# Patient Record
Sex: Male | Born: 1935 | Race: Black or African American | Hispanic: No
Health system: Southern US, Community
[De-identification: ages and names within clinical notes are randomized; demographics above are authoritative.]

---

## 2006-04-19 ENCOUNTER — Ambulatory Visit: Payer: Self-pay | Admitting: Internal Medicine

## 2007-12-18 ENCOUNTER — Emergency Department: Payer: Self-pay | Admitting: Emergency Medicine

## 2009-10-23 ENCOUNTER — Emergency Department: Payer: Self-pay | Admitting: Emergency Medicine

## 2009-11-03 ENCOUNTER — Emergency Department: Payer: Self-pay | Admitting: Emergency Medicine

## 2010-01-21 ENCOUNTER — Ambulatory Visit: Payer: Self-pay | Admitting: Internal Medicine

## 2010-01-22 ENCOUNTER — Inpatient Hospital Stay: Payer: Self-pay | Admitting: Internal Medicine

## 2010-02-03 ENCOUNTER — Ambulatory Visit: Payer: Self-pay | Admitting: Internal Medicine

## 2010-02-21 ENCOUNTER — Ambulatory Visit: Payer: Self-pay | Admitting: Internal Medicine

## 2010-04-08 ENCOUNTER — Inpatient Hospital Stay: Payer: Self-pay | Admitting: Specialist

## 2010-11-18 ENCOUNTER — Emergency Department: Payer: Self-pay | Admitting: Emergency Medicine

## 2010-11-24 ENCOUNTER — Emergency Department: Payer: Self-pay | Admitting: Emergency Medicine

## 2011-01-06 ENCOUNTER — Inpatient Hospital Stay: Payer: Self-pay | Admitting: Internal Medicine

## 2011-04-08 ENCOUNTER — Emergency Department: Payer: Self-pay | Admitting: Emergency Medicine

## 2011-04-08 LAB — COMPREHENSIVE METABOLIC PANEL
Anion Gap: 15 (ref 7–16)
Bilirubin,Total: 0.4 mg/dL (ref 0.2–1.0)
Co2: 23 mmol/L (ref 21–32)
Creatinine: 1.69 mg/dL — ABNORMAL HIGH (ref 0.60–1.30)
EGFR (African American): 51 — ABNORMAL LOW
EGFR (Non-African Amer.): 42 — ABNORMAL LOW
Glucose: 108 mg/dL — ABNORMAL HIGH (ref 65–99)
Osmolality: 315 (ref 275–301)
Sodium: 154 mmol/L — ABNORMAL HIGH (ref 136–145)

## 2011-04-08 LAB — CBC
HGB: 14.1 g/dL (ref 13.0–18.0)
MCH: 29.8 pg (ref 26.0–34.0)
MCHC: 33.6 g/dL (ref 32.0–36.0)
MCV: 89 fL (ref 80–100)
Platelet: 200 10*3/uL (ref 150–440)
RDW: 15.4 % — ABNORMAL HIGH (ref 11.5–14.5)

## 2011-04-08 LAB — TROPONIN I: Troponin-I: <0.02 ng/mL

## 2011-04-08 LAB — PRO B NATRIURETIC PEPTIDE: B-Type Natriuretic Peptide: 1329 pg/mL — ABNORMAL HIGH (ref 0–125)

## 2011-04-08 LAB — CK TOTAL AND CKMB (NOT AT ARMC): CK, Total: 75 U/L (ref 35–232)

## 2011-05-03 ENCOUNTER — Inpatient Hospital Stay: Payer: Self-pay | Admitting: Internal Medicine

## 2011-05-03 LAB — URINALYSIS, COMPLETE
Bacteria: NONE SEEN
Bilirubin,UR: NEGATIVE
Glucose,UR: NEGATIVE mg/dL (ref 0–75)
Hyaline Cast: 9
Nitrite: NEGATIVE
Ph: 6 (ref 4.5–8.0)
Specific Gravity: 1.013 (ref 1.003–1.030)
Squamous Epithelial: <1

## 2011-05-03 LAB — COMPREHENSIVE METABOLIC PANEL
Alkaline Phosphatase: 49 U/L — ABNORMAL LOW (ref 50–136)
Anion Gap: 15 (ref 7–16)
BUN: 21 mg/dL — ABNORMAL HIGH (ref 7–18)
Calcium, Total: 9.3 mg/dL (ref 8.5–10.1)
Chloride: 98 mmol/L (ref 98–107)
Co2: 22 mmol/L (ref 21–32)
EGFR (Non-African Amer.): >60
Osmolality: 274 (ref 275–301)
Potassium: 4.3 mmol/L (ref 3.5–5.1)
SGOT(AST): 23 U/L (ref 15–37)
Sodium: 135 mmol/L — ABNORMAL LOW (ref 136–145)

## 2011-05-03 LAB — CBC
HGB: 13.5 g/dL (ref 13.0–18.0)
MCH: 28.4 pg (ref 26.0–34.0)
MCV: 87 fL (ref 80–100)
RBC: 4.73 10*6/uL (ref 4.40–5.90)
WBC: 10.5 10*3/uL (ref 3.8–10.6)

## 2011-05-03 LAB — LIPASE, BLOOD: Lipase: 61 U/L — ABNORMAL LOW (ref 73–393)

## 2011-05-03 LAB — TROPONIN I
Troponin-I: <0.02 ng/mL
Troponin-I: 0.02 ng/mL

## 2011-05-04 LAB — CBC WITH DIFFERENTIAL/PLATELET
Basophil #: 0 10*3/uL (ref 0.0–0.1)
Basophil %: 0.2 %
Eosinophil %: 2.8 %
HCT: 36.7 % — ABNORMAL LOW (ref 40.0–52.0)
HGB: 12 g/dL — ABNORMAL LOW (ref 13.0–18.0)
Lymphocyte #: 1.5 10*3/uL (ref 1.0–3.6)
Lymphocyte %: 17.7 %
MCHC: 32.8 g/dL (ref 32.0–36.0)
MCV: 88 fL (ref 80–100)
Monocyte %: 15.5 %
Neutrophil #: 5.4 10*3/uL (ref 1.4–6.5)
RDW: 16.8 % — ABNORMAL HIGH (ref 11.5–14.5)
WBC: 8.4 10*3/uL (ref 3.8–10.6)

## 2011-05-04 LAB — BASIC METABOLIC PANEL
Anion Gap: 13 (ref 7–16)
Calcium, Total: 8.6 mg/dL (ref 8.5–10.1)
Chloride: 104 mmol/L (ref 98–107)
Co2: 24 mmol/L (ref 21–32)
EGFR (African American): >60
EGFR (Non-African Amer.): >60
Sodium: 141 mmol/L (ref 136–145)

## 2011-05-09 LAB — CULTURE, BLOOD (SINGLE)

## 2011-05-23 ENCOUNTER — Ambulatory Visit: Payer: Self-pay | Admitting: Internal Medicine

## 2011-05-31 ENCOUNTER — Inpatient Hospital Stay: Payer: Self-pay | Admitting: Internal Medicine

## 2011-05-31 LAB — COMPREHENSIVE METABOLIC PANEL
Albumin: 2.3 g/dL — ABNORMAL LOW (ref 3.4–5.0)
Alkaline Phosphatase: 71 U/L (ref 50–136)
Anion Gap: 16 (ref 7–16)
BUN: 105 mg/dL — ABNORMAL HIGH (ref 7–18)
Bilirubin,Total: 0.4 mg/dL (ref 0.2–1.0)
Calcium, Total: 8.9 mg/dL (ref 8.5–10.1)
Creatinine: 10.55 mg/dL — ABNORMAL HIGH (ref 0.60–1.30)
Potassium: 4.6 mmol/L (ref 3.5–5.1)
SGPT (ALT): 11 U/L — ABNORMAL LOW
Sodium: 139 mmol/L (ref 136–145)
Total Protein: 8.9 g/dL — ABNORMAL HIGH (ref 6.4–8.2)

## 2011-05-31 LAB — CK TOTAL AND CKMB (NOT AT ARMC)
CK, Total: 34 U/L — ABNORMAL LOW (ref 35–232)
CK-MB: 0.9 ng/mL (ref 0.5–3.6)
CK-MB: 1 ng/mL (ref 0.5–3.6)

## 2011-05-31 LAB — CBC
HGB: 11 g/dL — ABNORMAL LOW (ref 13.0–18.0)
MCH: 27.8 pg (ref 26.0–34.0)
MCHC: 33.1 g/dL (ref 32.0–36.0)
MCV: 84 fL (ref 80–100)
RBC: 3.96 10*6/uL — ABNORMAL LOW (ref 4.40–5.90)
RDW: 17.3 % — ABNORMAL HIGH (ref 11.5–14.5)

## 2011-05-31 LAB — URINALYSIS, COMPLETE
Bilirubin,UR: NEGATIVE
Leukocyte Esterase: NEGATIVE
Nitrite: NEGATIVE
Protein: 30
RBC,UR: 6 /HPF (ref 0–5)

## 2011-05-31 LAB — TROPONIN I
Troponin-I: <0.02 ng/mL
Troponin-I: 0.03 ng/mL

## 2011-06-01 LAB — CBC WITH DIFFERENTIAL/PLATELET
Basophil #: 0 10*3/uL (ref 0.0–0.1)
Eosinophil #: 0.6 10*3/uL (ref 0.0–0.7)
HCT: 29.1 % — ABNORMAL LOW (ref 40.0–52.0)
HGB: 9.8 g/dL — ABNORMAL LOW (ref 13.0–18.0)
Lymphocyte #: 1.3 10*3/uL (ref 1.0–3.6)
Lymphocyte %: 9.8 %
MCH: 27.6 pg (ref 26.0–34.0)
MCHC: 33.7 g/dL (ref 32.0–36.0)
MCV: 82 fL (ref 80–100)
Neutrophil #: 9.5 10*3/uL — ABNORMAL HIGH (ref 1.4–6.5)
Neutrophil %: 74.7 %
Platelet: 226 10*3/uL (ref 150–440)
RBC: 3.55 10*6/uL — ABNORMAL LOW (ref 4.40–5.90)
RDW: 18.1 % — ABNORMAL HIGH (ref 11.5–14.5)
WBC: 12.7 10*3/uL — ABNORMAL HIGH (ref 3.8–10.6)

## 2011-06-01 LAB — PHOSPHORUS: Phosphorus: 5.3 mg/dL — ABNORMAL HIGH (ref 2.5–4.9)

## 2011-06-01 LAB — BASIC METABOLIC PANEL
Anion Gap: 14 (ref 7–16)
BUN: 96 mg/dL — ABNORMAL HIGH (ref 7–18)
Chloride: 108 mmol/L — ABNORMAL HIGH (ref 98–107)
Co2: 19 mmol/L — ABNORMAL LOW (ref 21–32)
Creatinine: 9.49 mg/dL — ABNORMAL HIGH (ref 0.60–1.30)
EGFR (Non-African Amer.): 6 — ABNORMAL LOW
Osmolality: 311 (ref 275–301)
Potassium: 4.2 mmol/L (ref 3.5–5.1)

## 2011-06-01 LAB — IRON AND TIBC
Iron Bind.Cap.(Total): 155 ug/dL — ABNORMAL LOW (ref 250–450)
Unbound Iron-Bind.Cap.: 121 ug/dL

## 2011-06-01 LAB — PROTEIN / CREATININE RATIO, URINE: Creatinine, Urine: 77.2 mg/dL (ref 30.0–125.0)

## 2011-06-01 LAB — URIC ACID: Uric Acid: 6.2 mg/dL (ref 3.5–7.2)

## 2011-06-01 LAB — CK TOTAL AND CKMB (NOT AT ARMC): CK-MB: 1.1 ng/mL (ref 0.5–3.6)

## 2011-06-02 LAB — RENAL FUNCTION PANEL
Albumin: 1.8 g/dL — ABNORMAL LOW (ref 3.4–5.0)
Chloride: 112 mmol/L — ABNORMAL HIGH (ref 98–107)
Co2: 16 mmol/L — ABNORMAL LOW (ref 21–32)
Creatinine: 7.43 mg/dL — ABNORMAL HIGH (ref 0.60–1.30)
EGFR (African American): 9 — ABNORMAL LOW
EGFR (Non-African Amer.): 8 — ABNORMAL LOW
Glucose: 79 mg/dL (ref 65–99)
Osmolality: 303 (ref 275–301)
Phosphorus: 4.6 mg/dL (ref 2.5–4.9)

## 2011-06-02 LAB — KAPPA/LAMBDA FREE LIGHT CHAINS (ARMC)

## 2011-06-02 LAB — UR PROT ELECTROPHORESIS, URINE RANDOM

## 2011-06-02 LAB — PROTEIN ELECTROPHORESIS(ARMC)

## 2011-06-03 LAB — BASIC METABOLIC PANEL
BUN: 59 mg/dL — ABNORMAL HIGH (ref 7–18)
EGFR (African American): 10 — ABNORMAL LOW
EGFR (Non-African Amer.): 8 — ABNORMAL LOW
Glucose: 93 mg/dL (ref 65–99)
Potassium: 3.7 mmol/L (ref 3.5–5.1)

## 2011-06-04 LAB — BASIC METABOLIC PANEL
Anion Gap: 11 (ref 7–16)
BUN: 48 mg/dL — ABNORMAL HIGH (ref 7–18)
Calcium, Total: 8 mg/dL — ABNORMAL LOW (ref 8.5–10.1)
Chloride: 108 mmol/L — ABNORMAL HIGH (ref 98–107)
Co2: 24 mmol/L (ref 21–32)
Creatinine: 5.2 mg/dL — ABNORMAL HIGH (ref 0.60–1.30)
EGFR (African American): 12 — ABNORMAL LOW
EGFR (Non-African Amer.): 10 — ABNORMAL LOW
Glucose: 111 mg/dL — ABNORMAL HIGH (ref 65–99)
Osmolality: 298 (ref 275–301)
Potassium: 3.6 mmol/L (ref 3.5–5.1)
Sodium: 143 mmol/L (ref 136–145)

## 2011-06-05 LAB — BASIC METABOLIC PANEL
Anion Gap: 8 (ref 7–16)
Calcium, Total: 7.9 mg/dL — ABNORMAL LOW (ref 8.5–10.1)
Chloride: 106 mmol/L (ref 98–107)
Creatinine: 4.27 mg/dL — ABNORMAL HIGH (ref 0.60–1.30)
EGFR (African American): 15 — ABNORMAL LOW
EGFR (Non-African Amer.): 13 — ABNORMAL LOW
Glucose: 90 mg/dL (ref 65–99)
Osmolality: 292 (ref 275–301)
Potassium: 3.5 mmol/L (ref 3.5–5.1)

## 2011-06-06 LAB — BASIC METABOLIC PANEL
Anion Gap: 12 (ref 7–16)
Calcium, Total: 8 mg/dL — ABNORMAL LOW (ref 8.5–10.1)
Chloride: 102 mmol/L (ref 98–107)
Creatinine: 3.64 mg/dL — ABNORMAL HIGH (ref 0.60–1.30)
EGFR (African American): 18 — ABNORMAL LOW
EGFR (Non-African Amer.): 16 — ABNORMAL LOW
Potassium: 3 mmol/L — ABNORMAL LOW (ref 3.5–5.1)

## 2011-06-06 LAB — PROT IMMUNOELECT,UR-24HR

## 2011-06-07 LAB — CBC WITH DIFFERENTIAL/PLATELET
Basophil %: 0.3 %
Eosinophil %: 5.8 %
Eosinophil: 4 %
HCT: 28.1 % — ABNORMAL LOW (ref 40.0–52.0)
HGB: 8.9 g/dL — ABNORMAL LOW (ref 13.0–18.0)
Lymphocyte #: 1.4 10*3/uL (ref 1.0–3.6)
Lymphocyte %: 13.5 %
MCH: 26.5 pg (ref 26.0–34.0)
Monocytes: 18 %
RDW: 17.6 % — ABNORMAL HIGH (ref 11.5–14.5)
Segmented Neutrophils: 63 %

## 2011-06-07 LAB — BASIC METABOLIC PANEL
BUN: 28 mg/dL — ABNORMAL HIGH (ref 7–18)
Calcium, Total: 8.1 mg/dL — ABNORMAL LOW (ref 8.5–10.1)
Co2: 26 mmol/L (ref 21–32)
Creatinine: 3.15 mg/dL — ABNORMAL HIGH (ref 0.60–1.30)
EGFR (African American): 22 — ABNORMAL LOW
EGFR (Non-African Amer.): 19 — ABNORMAL LOW
Glucose: 85 mg/dL (ref 65–99)

## 2011-06-07 LAB — RETICULOCYTES: Reticulocyte: 0.85 % (ref 0.5–1.5)

## 2011-06-08 LAB — BASIC METABOLIC PANEL
Calcium, Total: 7.9 mg/dL — ABNORMAL LOW (ref 8.5–10.1)
Chloride: 105 mmol/L (ref 98–107)
Co2: 25 mmol/L (ref 21–32)
Creatinine: 2.89 mg/dL — ABNORMAL HIGH (ref 0.60–1.30)
EGFR (Non-African Amer.): 21 — ABNORMAL LOW
Osmolality: 287 (ref 275–301)
Potassium: 3.4 mmol/L — ABNORMAL LOW (ref 3.5–5.1)

## 2011-06-09 LAB — BASIC METABOLIC PANEL
Anion Gap: 11 (ref 7–16)
BUN: 24 mg/dL — ABNORMAL HIGH (ref 7–18)
Calcium, Total: 8.1 mg/dL — ABNORMAL LOW (ref 8.5–10.1)
Chloride: 104 mmol/L (ref 98–107)
Co2: 25 mmol/L (ref 21–32)
Creatinine: 2.57 mg/dL — ABNORMAL HIGH (ref 0.60–1.30)
EGFR (African American): 28 — ABNORMAL LOW
EGFR (Non-African Amer.): 24 — ABNORMAL LOW
Glucose: 127 mg/dL — ABNORMAL HIGH (ref 65–99)
Osmolality: 285 (ref 275–301)
Potassium: 3.6 mmol/L (ref 3.5–5.1)
Sodium: 140 mmol/L (ref 136–145)

## 2011-06-10 LAB — BASIC METABOLIC PANEL
Anion Gap: 11 (ref 7–16)
BUN: 29 mg/dL — ABNORMAL HIGH (ref 7–18)
Calcium, Total: 8.1 mg/dL — ABNORMAL LOW (ref 8.5–10.1)
Chloride: 104 mmol/L (ref 98–107)
Co2: 25 mmol/L (ref 21–32)
EGFR (African American): 35 — ABNORMAL LOW
EGFR (Non-African Amer.): 30 — ABNORMAL LOW
Osmolality: 286 (ref 275–301)
Potassium: 3.6 mmol/L (ref 3.5–5.1)

## 2011-06-11 LAB — TROPONIN I: Troponin-I: 0.03 ng/mL

## 2011-06-12 LAB — CBC WITH DIFFERENTIAL/PLATELET
Basophil #: 0 10*3/uL (ref 0.0–0.1)
Eosinophil #: 0.1 10*3/uL (ref 0.0–0.7)
HCT: 30.8 % — ABNORMAL LOW (ref 40.0–52.0)
Lymphocyte %: 23.5 %
MCV: 86 fL (ref 80–100)
Monocyte %: 14.6 %
Neutrophil #: 5.5 10*3/uL (ref 1.4–6.5)
Neutrophil %: 60.7 %
RBC: 3.56 10*6/uL — ABNORMAL LOW (ref 4.40–5.90)
RDW: 17.6 % — ABNORMAL HIGH (ref 11.5–14.5)
WBC: 9 10*3/uL (ref 3.8–10.6)

## 2011-06-12 LAB — BASIC METABOLIC PANEL
Anion Gap: 11 (ref 7–16)
BUN: 32 mg/dL — ABNORMAL HIGH (ref 7–18)
Calcium, Total: 8.3 mg/dL — ABNORMAL LOW (ref 8.5–10.1)
Co2: 24 mmol/L (ref 21–32)
Creatinine: 1.42 mg/dL — ABNORMAL HIGH (ref 0.60–1.30)
Glucose: 75 mg/dL (ref 65–99)
Osmolality: 285 (ref 275–301)

## 2011-06-12 LAB — FERRITIN: Ferritin (ARMC): 544 ng/mL — ABNORMAL HIGH (ref 8–388)

## 2011-06-13 LAB — BASIC METABOLIC PANEL
Anion Gap: 8 (ref 7–16)
BUN: 34 mg/dL — ABNORMAL HIGH (ref 7–18)
Chloride: 107 mmol/L (ref 98–107)
Creatinine: 1.52 mg/dL — ABNORMAL HIGH (ref 0.60–1.30)
EGFR (African American): 52 — ABNORMAL LOW
Osmolality: 293 (ref 275–301)
Potassium: 4.3 mmol/L (ref 3.5–5.1)
Sodium: 143 mmol/L (ref 136–145)

## 2011-06-13 LAB — IRON AND TIBC
Iron Saturation: 20 %
Iron: 39 ug/dL — ABNORMAL LOW (ref 65–175)
Unbound Iron-Bind.Cap.: 153 ug/dL

## 2011-06-14 LAB — BASIC METABOLIC PANEL
Anion Gap: 7 (ref 7–16)
Calcium, Total: 8.6 mg/dL (ref 8.5–10.1)
Co2: 29 mmol/L (ref 21–32)
EGFR (Non-African Amer.): 47 — ABNORMAL LOW
Glucose: 89 mg/dL (ref 65–99)
Osmolality: 286 (ref 275–301)
Potassium: 3.9 mmol/L (ref 3.5–5.1)
Sodium: 140 mmol/L (ref 136–145)

## 2011-06-22 ENCOUNTER — Ambulatory Visit: Payer: Self-pay | Admitting: Internal Medicine

## 2011-07-20 ENCOUNTER — Ambulatory Visit: Payer: Self-pay | Admitting: Internal Medicine

## 2011-07-20 LAB — CBC CANCER CENTER
Basophil #: 0 x10 3/mm (ref 0.0–0.1)
Basophil %: 0.7 %
Eosinophil #: 0.3 x10 3/mm (ref 0.0–0.7)
HCT: 32.8 % — ABNORMAL LOW (ref 40.0–52.0)
Lymphocyte %: 37.2 %
MCHC: 31.7 g/dL — ABNORMAL LOW (ref 32.0–36.0)
Monocyte #: 0.8 x10 3/mm (ref 0.2–1.0)
Monocyte %: 13.6 %
Neutrophil #: 2.4 x10 3/mm (ref 1.4–6.5)
Neutrophil %: 43.5 %
Platelet: 254 x10 3/mm (ref 150–440)
RBC: 3.62 10*6/uL — ABNORMAL LOW (ref 4.40–5.90)
RDW: 17.4 % — ABNORMAL HIGH (ref 11.5–14.5)
WBC: 5.6 x10 3/mm (ref 3.8–10.6)

## 2011-07-20 LAB — BASIC METABOLIC PANEL
BUN: 12 mg/dL (ref 7–18)
Calcium, Total: 8.8 mg/dL (ref 8.5–10.1)
Chloride: 107 mmol/L (ref 98–107)
Co2: 29 mmol/L (ref 21–32)
EGFR (African American): >60
EGFR (Non-African Amer.): >60
Osmolality: 288 (ref 275–301)

## 2011-07-23 ENCOUNTER — Ambulatory Visit: Payer: Self-pay | Admitting: Internal Medicine

## 2011-11-22 IMAGING — CT CT NECK WITHOUT CONTRAST
1 of 3 series · 7 of 14 positions shown, 9 images · non-contrast
Comparison: none

REASON FOR EXAM: eval left neck pain with palpable mass
COMMENTS:

PROCEDURE:     CT  - CT NECK WITHOUT CONTRAST  - October 23, 2009  [DATE]
RESULT:
HISTORY: Pain and mass.
COMPARISON STUDY:   No prior.

[Series 2: soft tissue · axial · 0.44mm/px · z∈[+206,+430]mm · 7 of 101 slices shown, 9 images]
[im 13/101  soft-tissue]
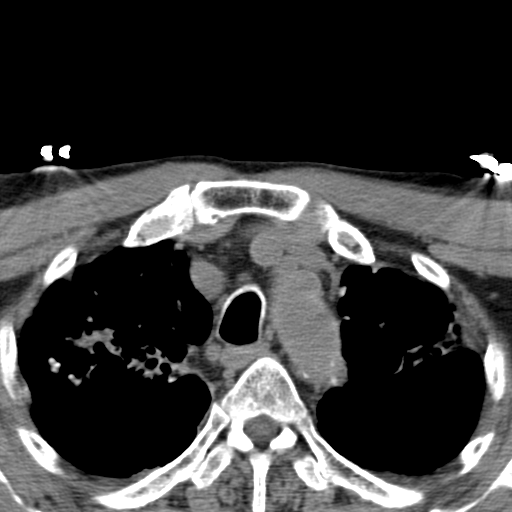
[im 13/101  bone]
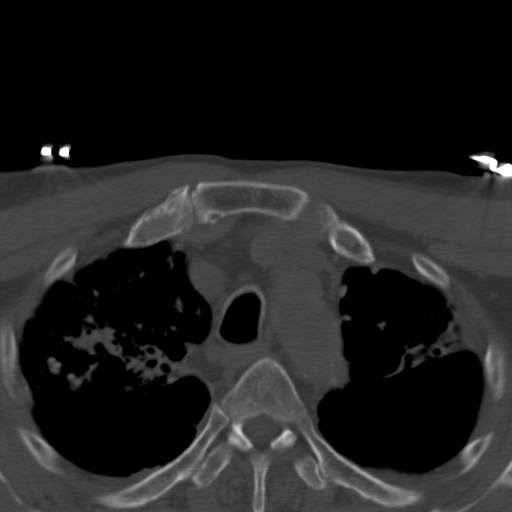
[im 26/101  bone]
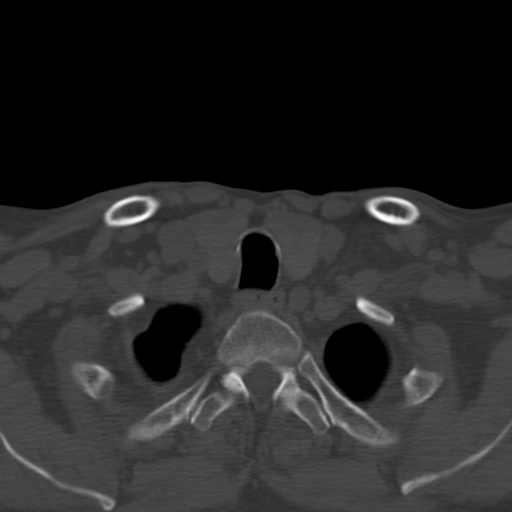
[im 38/101  bone]
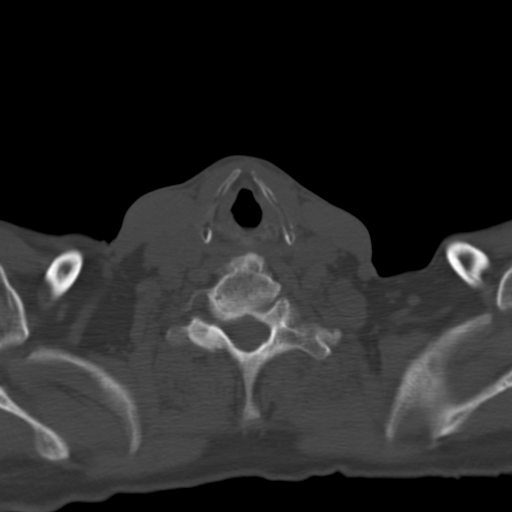
[im 51/101  bone]
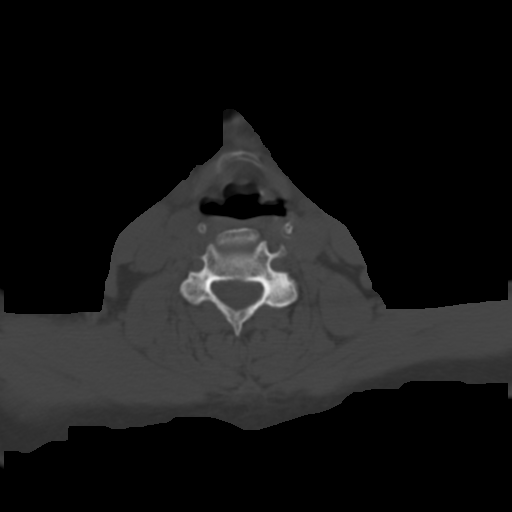
[im 63/101  soft-tissue]
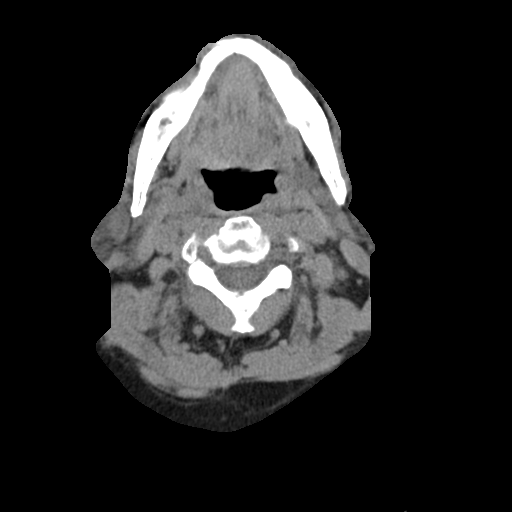
[im 63/101  bone]
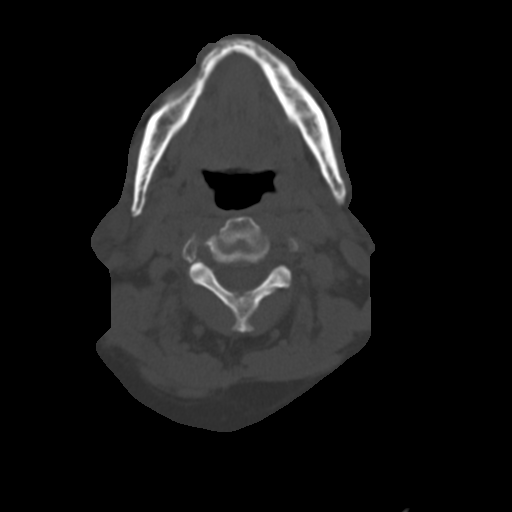
[im 76/101  bone]
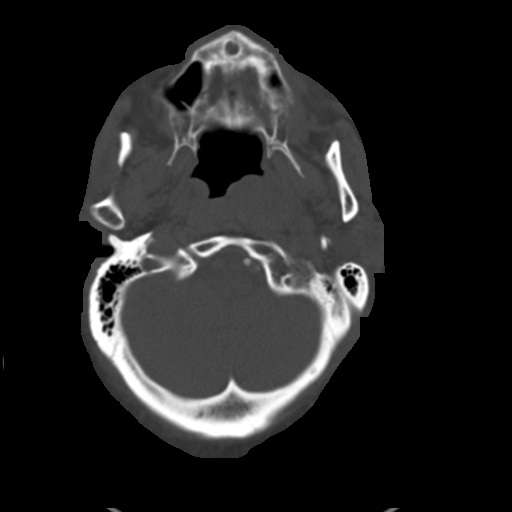
[im 88/101  bone]
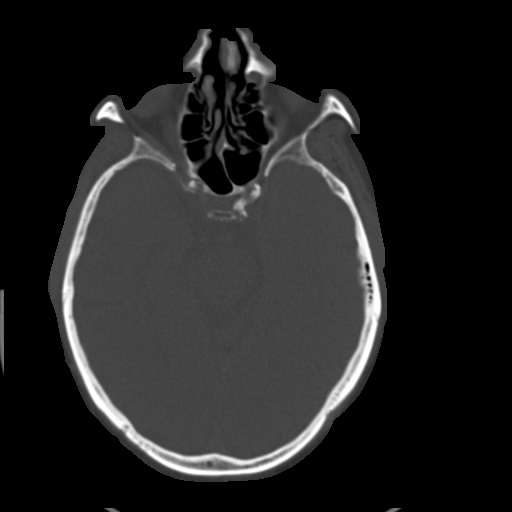

[7 of 14 positions shown; findings below may reference images not displayed]

FINDINGS: Standard nonenhanced CT of the neck was obtained. No recent
prior studies available for comparison. There is soft tissue prominence in
the left posterior nasopharyngeal and oral pharyngeal mucosa. This is
suspicious for a malignancy in this region. Direct visualization for further
evaluation is suggested. The frontal sinuses, ethmoid sinuses, maxillary
sinuses and sphenoid sinuses are clear. Parotid glands are normal.
Submandibular glands are normal. Shotty cervical lymph nodes are noted.
Larynx is grossly normal. Thyroid is normal. Exam is limited due to the lack
of contrast. Extensive apical pulmonary nodularity and bullous change is
present. The possibility of active granulomatous disease or pneumonitis
presenting in this fashion cannot be excluded. The possibility of malignancy
including metastatic disease cannot be excluded. A PET CT with
high-resolution neck images may prove useful for further evaluation.
IMPRESSION: 1. Possible left oropharyngeal/nasopharyngeal mass lesion.
2. Grossly abnormal pulmonary apices with multiple nodules and bullous
changes. The possibility of underlying active granulomas disease including
TB presenting in this fashion cannot be excluded. The possibly of underlying
primary tumor or metastatic disease cannot be excluded. A PET CT with
high-resolution neck images may prove useful for further evaluation of this
patient.

## 2012-03-04 ENCOUNTER — Ambulatory Visit: Payer: Self-pay | Admitting: Internal Medicine

## 2012-03-08 ENCOUNTER — Ambulatory Visit: Payer: Self-pay | Admitting: Gastroenterology

## 2012-05-02 LAB — COMPREHENSIVE METABOLIC PANEL
Albumin: 3.4 g/dL (ref 3.4–5.0)
BUN: 20 mg/dL — ABNORMAL HIGH (ref 7–18)
Bilirubin,Total: 0.2 mg/dL (ref 0.2–1.0)
Chloride: 111 mmol/L — ABNORMAL HIGH (ref 98–107)
Co2: 24 mmol/L (ref 21–32)
Creatinine: 1.43 mg/dL — ABNORMAL HIGH (ref 0.60–1.30)
EGFR (African American): 56 — ABNORMAL LOW
Glucose: 140 mg/dL — ABNORMAL HIGH (ref 65–99)
Potassium: 3.7 mmol/L (ref 3.5–5.1)
Sodium: 141 mmol/L (ref 136–145)
Total Protein: 8.5 g/dL — ABNORMAL HIGH (ref 6.4–8.2)

## 2012-05-02 LAB — TROPONIN I: Troponin-I: 0.02 ng/mL

## 2012-05-02 LAB — CBC
MCH: 28.2 pg (ref 26.0–34.0)
MCHC: 32.8 g/dL (ref 32.0–36.0)
MCV: 86 fL (ref 80–100)
RBC: 4.43 10*6/uL (ref 4.40–5.90)
RDW: 15.1 % — ABNORMAL HIGH (ref 11.5–14.5)

## 2012-05-02 LAB — CK TOTAL AND CKMB (NOT AT ARMC): CK, Total: 305 U/L — ABNORMAL HIGH (ref 35–232)

## 2012-05-03 ENCOUNTER — Inpatient Hospital Stay: Payer: Self-pay | Admitting: Internal Medicine

## 2012-05-03 LAB — CBC WITH DIFFERENTIAL/PLATELET
Basophil %: 0.4 %
Eosinophil #: 0.5 10*3/uL (ref 0.0–0.7)
HGB: 12 g/dL — ABNORMAL LOW (ref 13.0–18.0)
Lymphocyte #: 1.8 10*3/uL (ref 1.0–3.6)
Lymphocyte %: 30.1 %
MCH: 28.4 pg (ref 26.0–34.0)
MCV: 87 fL (ref 80–100)
Neutrophil #: 2.9 10*3/uL (ref 1.4–6.5)
Platelet: 152 10*3/uL (ref 150–440)
RBC: 4.21 10*6/uL — ABNORMAL LOW (ref 4.40–5.90)
WBC: 6.1 10*3/uL (ref 3.8–10.6)

## 2012-05-03 LAB — COMPREHENSIVE METABOLIC PANEL
BUN: 15 mg/dL (ref 7–18)
Calcium, Total: 8.1 mg/dL — ABNORMAL LOW (ref 8.5–10.1)
Chloride: 112 mmol/L — ABNORMAL HIGH (ref 98–107)
Co2: 24 mmol/L (ref 21–32)
Creatinine: 1.27 mg/dL (ref 0.60–1.30)
Osmolality: 285 (ref 275–301)
Potassium: 3.9 mmol/L (ref 3.5–5.1)
SGPT (ALT): 16 U/L (ref 12–78)
Total Protein: 7.3 g/dL (ref 6.4–8.2)

## 2012-05-03 LAB — LIPID PANEL
Cholesterol: 132 mg/dL (ref 0–200)
Ldl Cholesterol, Calc: 73 mg/dL (ref 0–100)
VLDL Cholesterol, Calc: 15 mg/dL (ref 5–40)

## 2012-05-04 LAB — SEDIMENTATION RATE: Erythrocyte Sed Rate: 31 mm/hr — ABNORMAL HIGH (ref 0–20)

## 2012-05-07 LAB — EXPECTORATED SPUTUM ASSESSMENT W GRAM STAIN, RFLX TO RESP C

## 2012-05-09 LAB — EXPECTORATED SPUTUM ASSESSMENT W GRAM STAIN, RFLX TO RESP C

## 2012-06-29 ENCOUNTER — Ambulatory Visit: Payer: Self-pay | Admitting: Internal Medicine

## 2012-07-02 LAB — CBC CANCER CENTER
Eosinophil %: 7.4 %
HCT: 39.1 % — ABNORMAL LOW (ref 40.0–52.0)
Lymphocyte #: 1.9 x10 3/mm (ref 1.0–3.6)
Lymphocyte %: 28.8 %
MCHC: 32.8 g/dL (ref 32.0–36.0)
MCV: 88 fL (ref 80–100)
Monocyte #: 0.8 x10 3/mm (ref 0.2–1.0)
Monocyte %: 12.1 %
Neutrophil #: 3.3 x10 3/mm (ref 1.4–6.5)
Neutrophil %: 50.9 %
RDW: 16.2 % — ABNORMAL HIGH (ref 11.5–14.5)
WBC: 6.5 x10 3/mm (ref 3.8–10.6)

## 2012-07-02 LAB — CREATININE, SERUM: EGFR (African American): 54 — ABNORMAL LOW

## 2012-07-22 ENCOUNTER — Ambulatory Visit: Payer: Self-pay | Admitting: Internal Medicine

## 2013-04-30 ENCOUNTER — Emergency Department: Payer: Self-pay | Admitting: Emergency Medicine

## 2013-04-30 ENCOUNTER — Ambulatory Visit (HOSPITAL_COMMUNITY)
Admission: AD | Admit: 2013-04-30 | Discharge: 2013-04-30 | Disposition: A | Discharge status: Home / Self Care | Payer: Medicare (Managed Care) | Source: Other Acute Inpatient Hospital | Attending: Emergency Medicine | Admitting: Emergency Medicine

## 2013-04-30 DIAGNOSIS — I609 Nontraumatic subarachnoid hemorrhage, unspecified: Secondary | ICD-10-CM | POA: Insufficient documentation

## 2013-04-30 LAB — CBC WITH DIFFERENTIAL/PLATELET
Basophil #: 0 10*3/uL (ref 0.0–0.1)
Basophil %: 0.4 %
EOS ABS: 0.3 10*3/uL (ref 0.0–0.7)
EOS PCT: 3.1 %
HCT: 42.3 % (ref 40.0–52.0)
HGB: 14 g/dL (ref 13.0–18.0)
Lymphocyte #: 1.5 10*3/uL (ref 1.0–3.6)
Lymphocyte %: 17.4 %
MCH: 29.7 pg (ref 26.0–34.0)
MCHC: 33.2 g/dL (ref 32.0–36.0)
MCV: 89 fL (ref 80–100)
MONOS PCT: 8.9 %
Monocyte #: 0.8 x10 3/mm (ref 0.2–1.0)
NEUTROS ABS: 6.2 10*3/uL (ref 1.4–6.5)
Neutrophil %: 70.2 %
PLATELETS: 159 10*3/uL (ref 150–440)
RBC: 4.73 10*6/uL (ref 4.40–5.90)
RDW: 15.3 % — ABNORMAL HIGH (ref 11.5–14.5)
WBC: 8.9 10*3/uL (ref 3.8–10.6)

## 2013-04-30 LAB — COMPREHENSIVE METABOLIC PANEL
ALBUMIN: 4 g/dL (ref 3.4–5.0)
ALT: 27 U/L (ref 12–78)
ANION GAP: 4 — AB (ref 7–16)
Alkaline Phosphatase: 54 U/L
BUN: 20 mg/dL — ABNORMAL HIGH (ref 7–18)
Bilirubin,Total: 0.5 mg/dL (ref 0.2–1.0)
CREATININE: 1.4 mg/dL — AB (ref 0.60–1.30)
Calcium, Total: 9.5 mg/dL (ref 8.5–10.1)
Chloride: 105 mmol/L (ref 98–107)
Co2: 30 mmol/L (ref 21–32)
EGFR (African American): 57 — ABNORMAL LOW
EGFR (Non-African Amer.): 49 — ABNORMAL LOW
Glucose: 100 mg/dL — ABNORMAL HIGH (ref 65–99)
Osmolality: 280 (ref 275–301)
POTASSIUM: 4.5 mmol/L (ref 3.5–5.1)
SGOT(AST): 39 U/L — ABNORMAL HIGH (ref 15–37)
SODIUM: 139 mmol/L (ref 136–145)
Total Protein: 9 g/dL — ABNORMAL HIGH (ref 6.4–8.2)

## 2013-04-30 LAB — PROTIME-INR
INR: 1
PROTHROMBIN TIME: 13 s (ref 11.5–14.7)

## 2013-06-01 IMAGING — CR DG CHEST 1V PORT
1 series · 1 of 1 positions shown · non-contrast
Comparison: none

REASON FOR EXAM: TACHYCARDIA
COMMENTS:

PROCEDURE:     DXR - DXR PORTABLE CHEST SINGLE VIEW  - May 03, 2011  [DATE]
RESULT:     Comparison: 04/08/2011 and CT of the chest 04/09/2010

[portable]
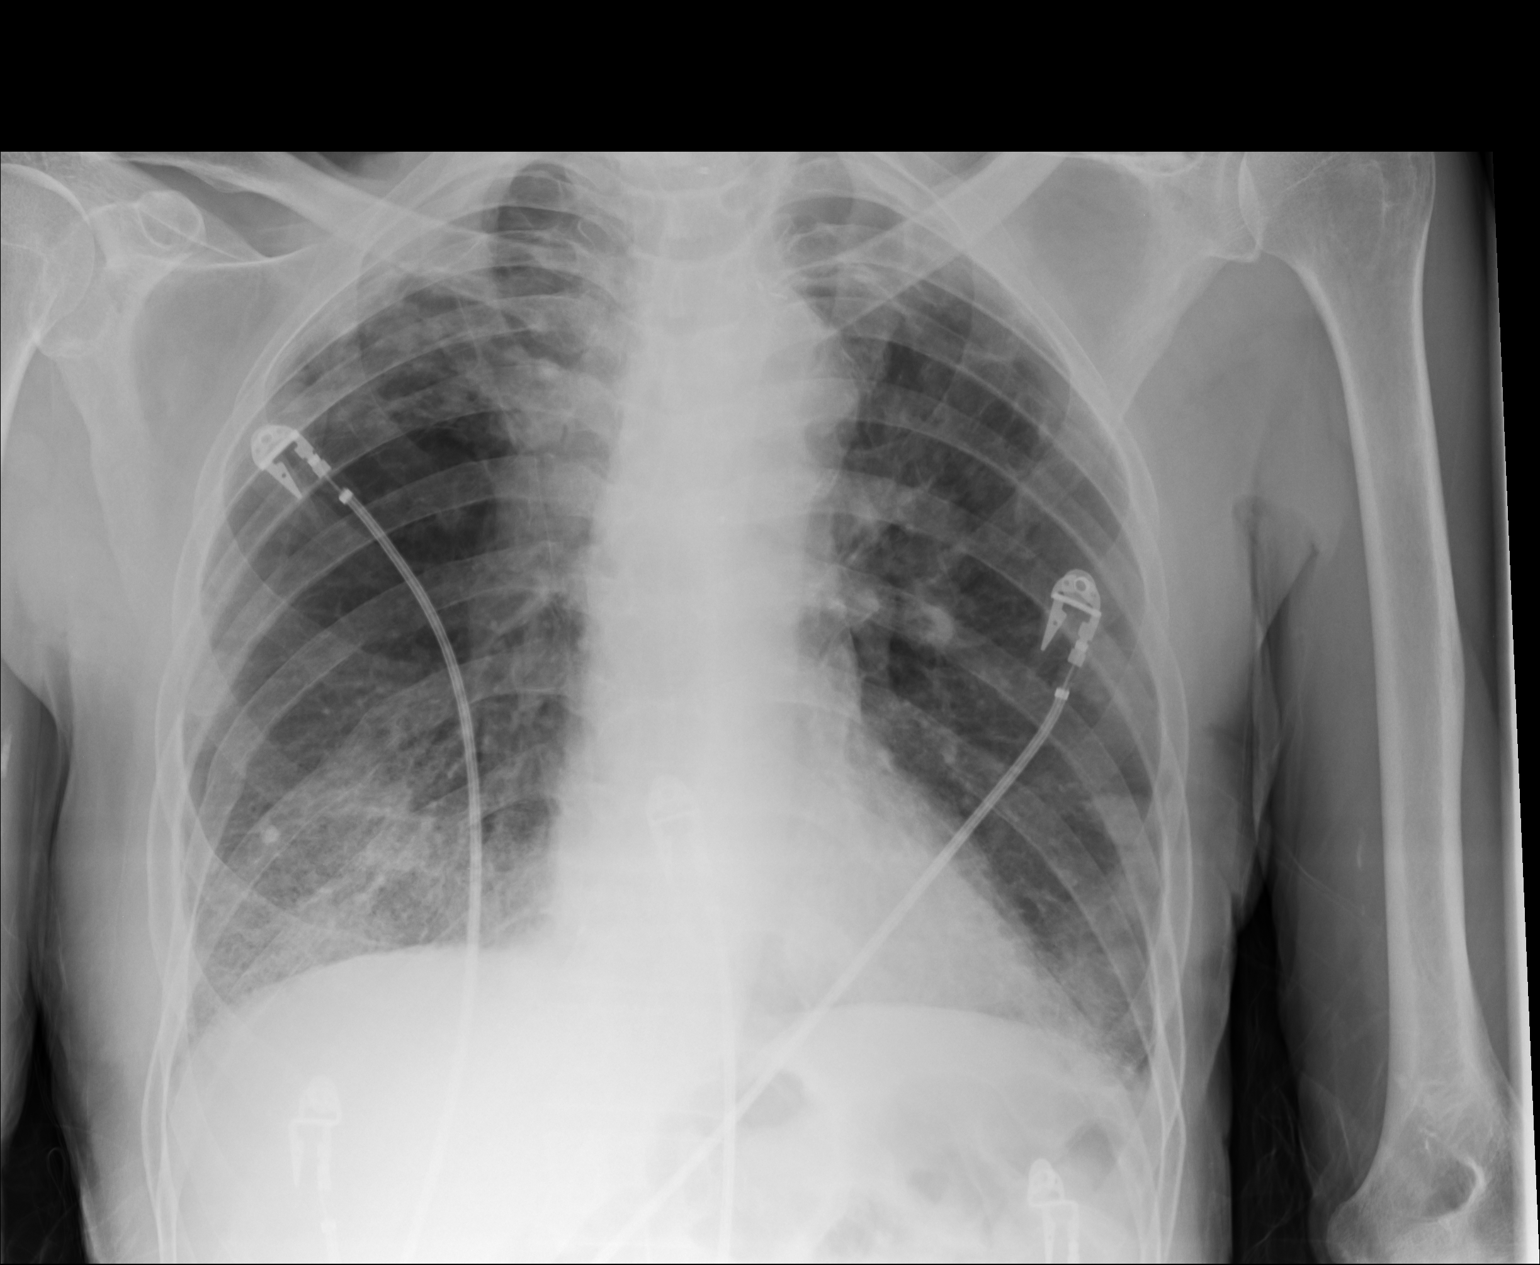

[1 of 1 positions shown; findings below may reference images not displayed]

FINDINGS: There are new heterogeneous opacities in the right lower lobe concerning for
infection. The heart and mediastinum are stable. Multiple nodular densities
in the right upper lobe and left lung are similar to prior and consistent
with the calcified nodules seen on the prior chest CT. Superior traction the
right hilum is similar to prior and likely secondary to scarring and
fibrotic changes. Minimal reticular opacities the left lung base are similar
to prior. There are bullous changes at the lung apices, similar to prior.
IMPRESSION: Findings concerning for pneumonia in the right lower lung. Followup
radiographs are recommended to ensure resolution.

## 2013-06-28 ENCOUNTER — Ambulatory Visit: Payer: Self-pay | Admitting: Internal Medicine

## 2013-07-02 ENCOUNTER — Emergency Department: Payer: Self-pay | Admitting: Emergency Medicine

## 2013-07-02 LAB — COMPREHENSIVE METABOLIC PANEL
AST: 29 U/L (ref 15–37)
Albumin: 2.7 g/dL — ABNORMAL LOW (ref 3.4–5.0)
Alkaline Phosphatase: 40 U/L — ABNORMAL LOW
Anion Gap: 3 — ABNORMAL LOW (ref 7–16)
BILIRUBIN TOTAL: 0.2 mg/dL (ref 0.2–1.0)
BUN: 10 mg/dL (ref 7–18)
Calcium, Total: 8.8 mg/dL (ref 8.5–10.1)
Chloride: 111 mmol/L — ABNORMAL HIGH (ref 98–107)
Co2: 29 mmol/L (ref 21–32)
Creatinine: 1.09 mg/dL (ref 0.60–1.30)
EGFR (African American): >60
Glucose: 101 mg/dL — ABNORMAL HIGH (ref 65–99)
Osmolality: 284 (ref 275–301)
Potassium: 3.9 mmol/L (ref 3.5–5.1)
SGPT (ALT): 13 U/L (ref 12–78)
Sodium: 143 mmol/L (ref 136–145)
TOTAL PROTEIN: 7.5 g/dL (ref 6.4–8.2)

## 2013-07-02 LAB — URINALYSIS, COMPLETE
BLOOD: NEGATIVE
Bacteria: NONE SEEN
Bilirubin,UR: NEGATIVE
GLUCOSE, UR: NEGATIVE mg/dL (ref 0–75)
Ketone: NEGATIVE
NITRITE: NEGATIVE
Ph: 7 (ref 4.5–8.0)
Protein: NEGATIVE
RBC,UR: NONE SEEN /HPF (ref 0–5)
SPECIFIC GRAVITY: 1.041 (ref 1.003–1.030)
SQUAMOUS EPITHELIAL: NONE SEEN
WBC UR: <1 /HPF (ref 0–5)

## 2013-07-02 LAB — CBC WITH DIFFERENTIAL/PLATELET
BASOS ABS: 0.1 10*3/uL (ref 0.0–0.1)
Basophil %: 0.5 %
Eosinophil #: 0.5 10*3/uL (ref 0.0–0.7)
Eosinophil %: 5.7 %
HCT: 35.9 % — ABNORMAL LOW (ref 40.0–52.0)
HGB: 11.5 g/dL — ABNORMAL LOW (ref 13.0–18.0)
LYMPHS ABS: 1.5 10*3/uL (ref 1.0–3.6)
Lymphocyte %: 16 %
MCH: 28.7 pg (ref 26.0–34.0)
MCHC: 32.1 g/dL (ref 32.0–36.0)
MCV: 89 fL (ref 80–100)
MONO ABS: 1.6 x10 3/mm — AB (ref 0.2–1.0)
MONOS PCT: 17.2 %
NEUTROS ABS: 5.6 10*3/uL (ref 1.4–6.5)
NEUTROS PCT: 60.6 %
Platelet: 213 10*3/uL (ref 150–440)
RBC: 4.02 10*6/uL — AB (ref 4.40–5.90)
RDW: 16 % — ABNORMAL HIGH (ref 11.5–14.5)
WBC: 9.3 10*3/uL (ref 3.8–10.6)

## 2013-07-02 LAB — LIPASE, BLOOD: LIPASE: 113 U/L (ref 73–393)

## 2013-07-04 IMAGING — CR DG CHEST 2V
1 series · 3 of 3 positions shown · non-contrast
Comparison: none

REASON FOR EXAM: follow up pneumonia
COMMENTS:

PROCEDURE:     DXR - DXR CHEST PA (OR AP) AND LATERAL  - June 05, 2011  [DATE]
RESULT:     Comparison is made to a prior study dated 05/31/2011.

[Series 5: w chest pa · 0.14mm/px · 3 of 3 slices shown]
[im 1/3]
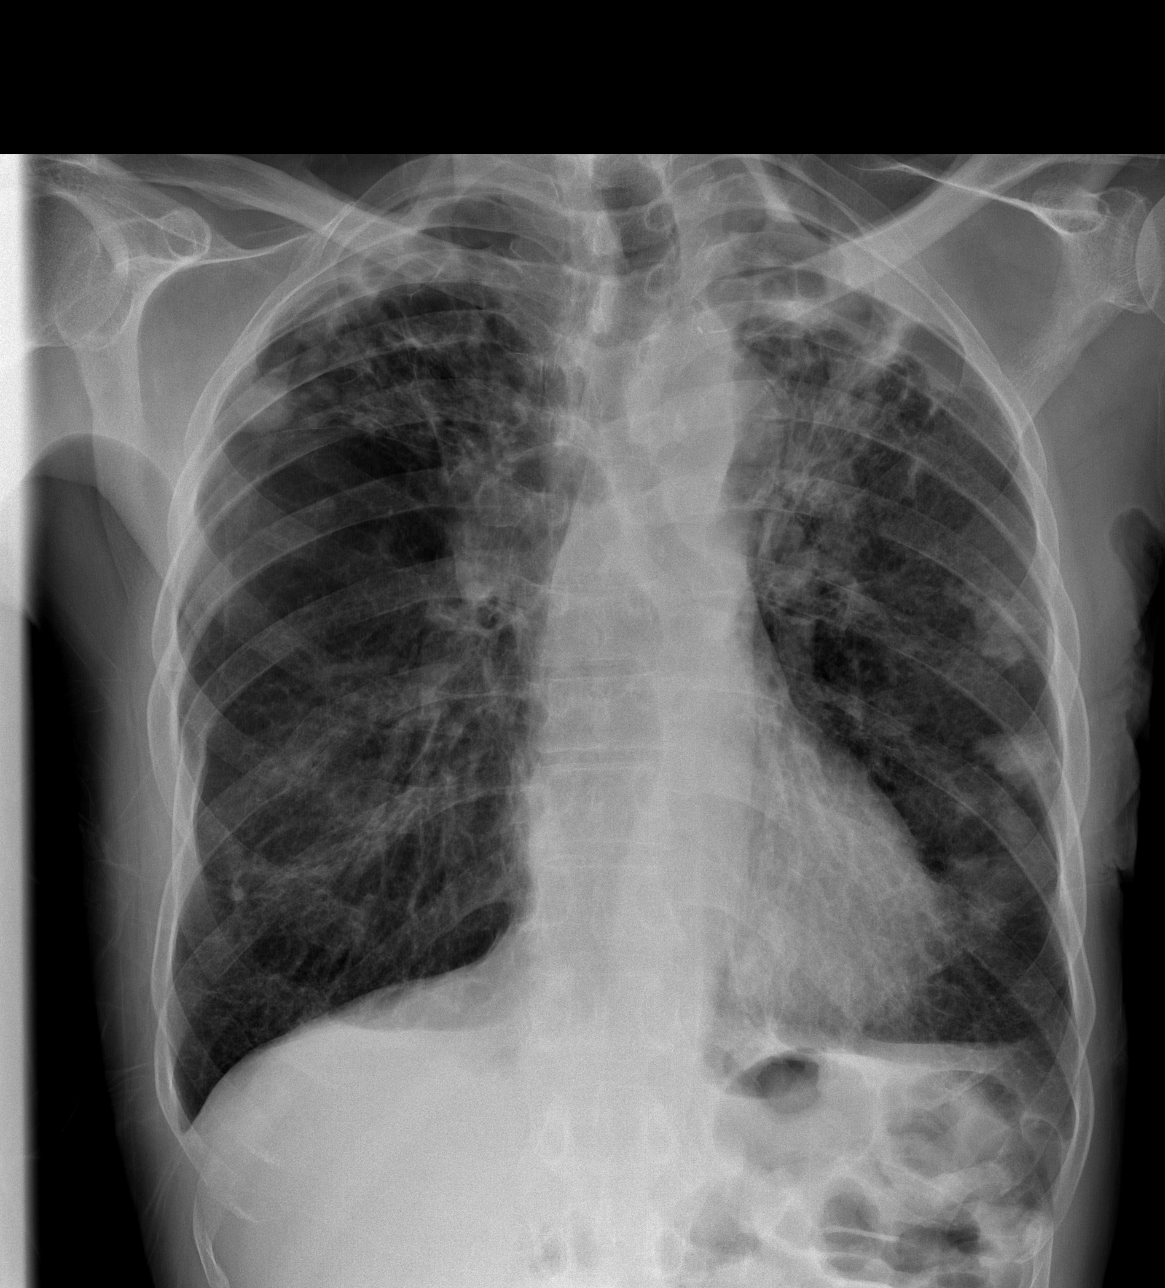
[im 2/3]
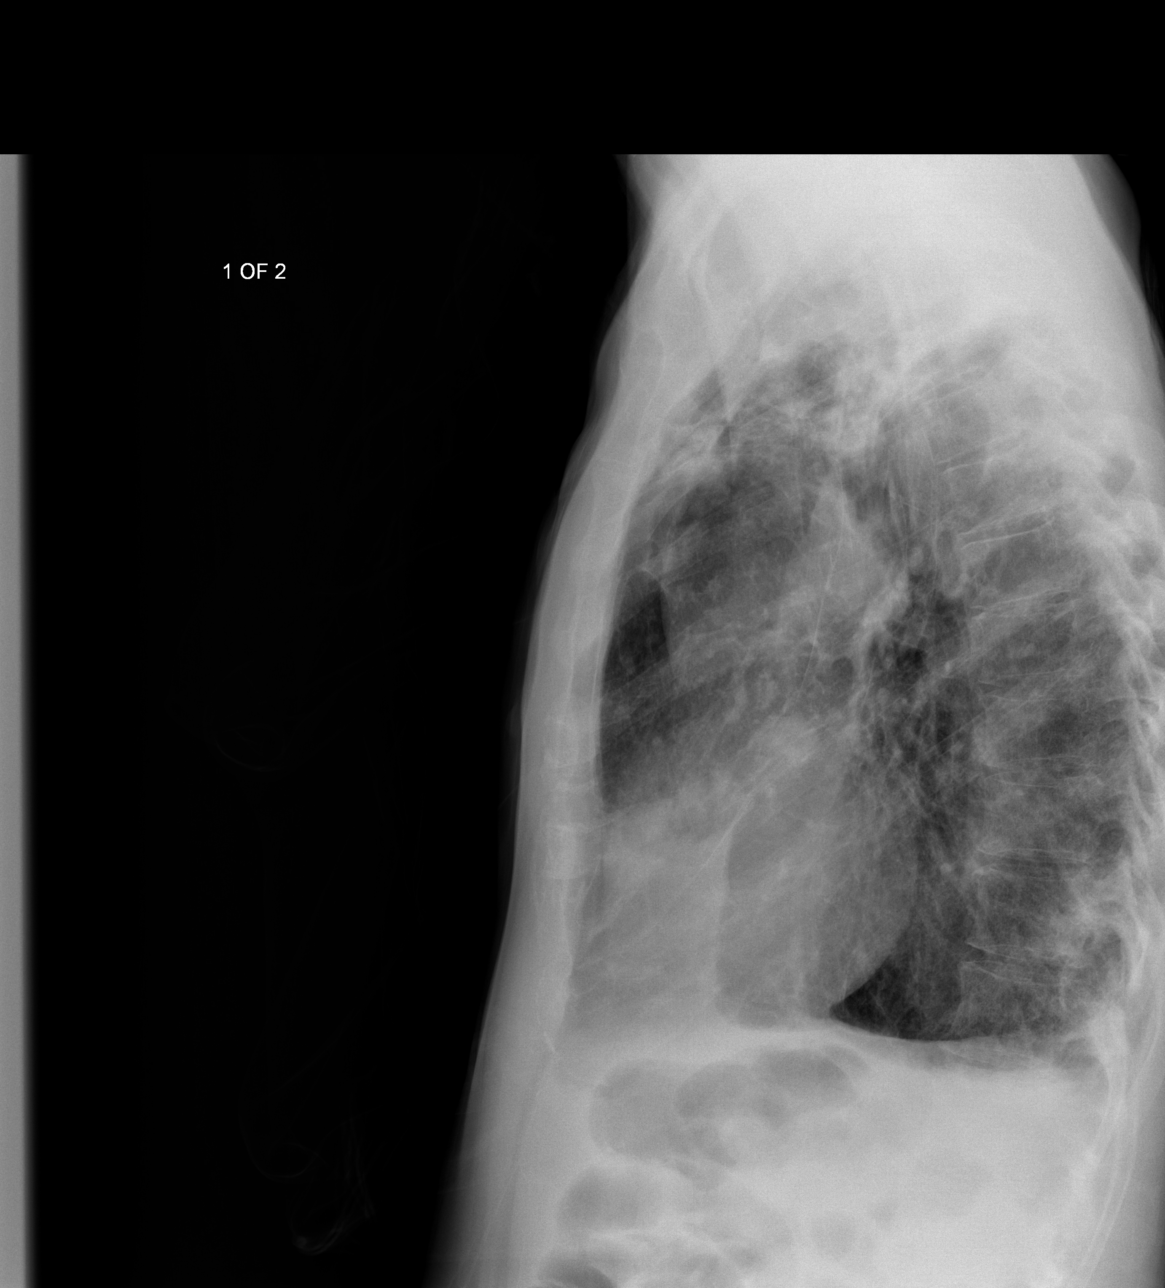
[im 3/3]
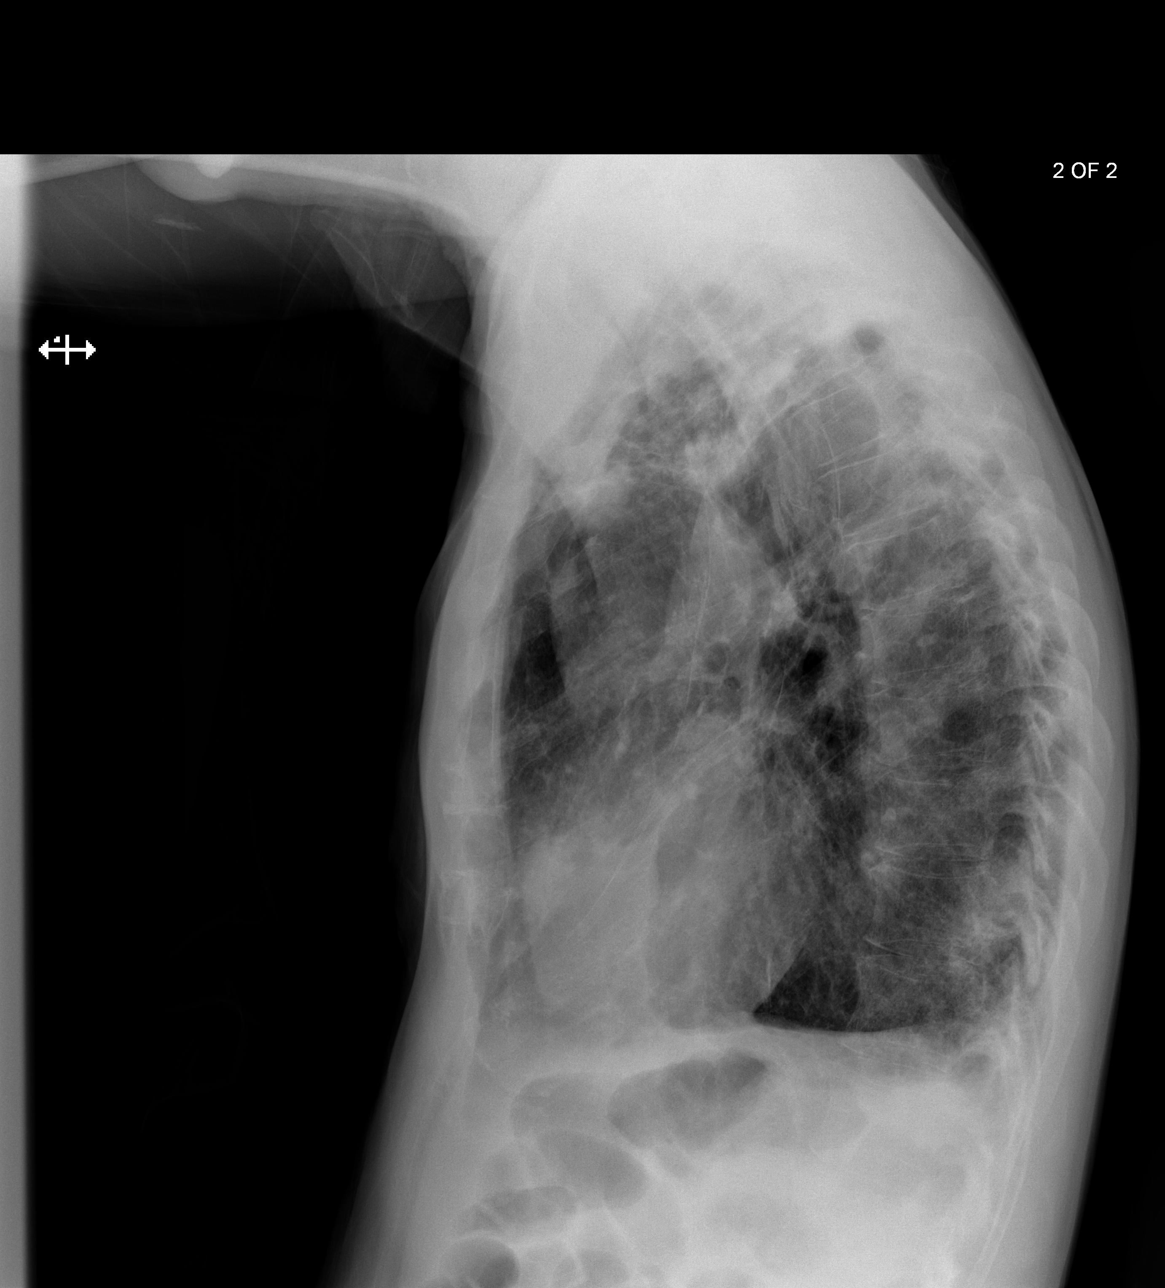

[3 of 3 positions shown; findings below may reference images not displayed]

FINDINGS: Diffuse thickening of the interstitial markings is appreciated
with multiple nodular densities identified within the periphery of the left
hemithorax as well as within the lung apices right greater than left. No
focal regions of consolidation are identified. There does appear to be a
component of bronchiectasis. Biapical pleural capping is identified. Cardiac
silhouette is within normal limits. The osseous structures demonstrate
possible subacute fracture involving the lateral aspect of the right
hemithorax.
IMPRESSION: 1. COPD.
2. Findings consistent with moderate to severe pulmonary interstitial
fibrosis.
3. Nodular densities within the lungs which may represent scarring though
primary pulmonary nodules are of diagnostic consideration and clinical
correlation recommended. A nodular infiltrate cannot be completely excluded
and/or inflammatory nodules, if clinically appropriate.

## 2013-07-17 ENCOUNTER — Inpatient Hospital Stay: Payer: Self-pay | Admitting: Internal Medicine

## 2013-07-17 DIAGNOSIS — R748 Abnormal levels of other serum enzymes: Secondary | ICD-10-CM

## 2013-07-17 DIAGNOSIS — I10 Essential (primary) hypertension: Secondary | ICD-10-CM

## 2013-07-17 DIAGNOSIS — R112 Nausea with vomiting, unspecified: Secondary | ICD-10-CM

## 2013-07-17 DIAGNOSIS — I359 Nonrheumatic aortic valve disorder, unspecified: Secondary | ICD-10-CM

## 2013-07-17 LAB — URINALYSIS, COMPLETE
BACTERIA: NONE SEEN
Bilirubin,UR: NEGATIVE
GLUCOSE, UR: NEGATIVE mg/dL (ref 0–75)
Leukocyte Esterase: NEGATIVE
Nitrite: NEGATIVE
Ph: 5 (ref 4.5–8.0)
Protein: NEGATIVE
RBC,UR: 1 /HPF (ref 0–5)
Specific Gravity: >1.06 (ref 1.003–1.030)
Squamous Epithelial: 1
WBC UR: <1 /HPF (ref 0–5)

## 2013-07-17 LAB — CBC WITH DIFFERENTIAL/PLATELET
Basophil #: 0 10*3/uL (ref 0.0–0.1)
Basophil %: 0.5 %
EOS ABS: 0.3 10*3/uL (ref 0.0–0.7)
EOS PCT: 3.7 %
HCT: 34.8 % — ABNORMAL LOW (ref 40.0–52.0)
HGB: 11.4 g/dL — ABNORMAL LOW (ref 13.0–18.0)
Lymphocyte #: 1.9 10*3/uL (ref 1.0–3.6)
Lymphocyte %: 25.8 %
MCH: 29.4 pg (ref 26.0–34.0)
MCHC: 32.8 g/dL (ref 32.0–36.0)
MCV: 90 fL (ref 80–100)
Monocyte #: 1 x10 3/mm (ref 0.2–1.0)
Monocyte %: 13.6 %
NEUTROS ABS: 4.2 10*3/uL (ref 1.4–6.5)
Neutrophil %: 56.4 %
PLATELETS: 308 10*3/uL (ref 150–440)
RBC: 3.88 10*6/uL — ABNORMAL LOW (ref 4.40–5.90)
RDW: 15.5 % — ABNORMAL HIGH (ref 11.5–14.5)
WBC: 7.5 10*3/uL (ref 3.8–10.6)

## 2013-07-17 LAB — CBC
HCT: 36.4 % — ABNORMAL LOW (ref 40.0–52.0)
HGB: 11.7 g/dL — AB (ref 13.0–18.0)
MCH: 28.5 pg (ref 26.0–34.0)
MCHC: 32 g/dL (ref 32.0–36.0)
MCV: 89 fL (ref 80–100)
Platelet: 324 10*3/uL (ref 150–440)
RBC: 4.1 10*6/uL — ABNORMAL LOW (ref 4.40–5.90)
RDW: 15.7 % — ABNORMAL HIGH (ref 11.5–14.5)
WBC: 6.9 10*3/uL (ref 3.8–10.6)

## 2013-07-17 LAB — COMPREHENSIVE METABOLIC PANEL
ALK PHOS: 56 U/L
ALT: 11 U/L — AB (ref 12–78)
Albumin: 2.7 g/dL — ABNORMAL LOW (ref 3.4–5.0)
Anion Gap: 6 — ABNORMAL LOW (ref 7–16)
BUN: 10 mg/dL (ref 7–18)
Bilirubin,Total: 0.5 mg/dL (ref 0.2–1.0)
CHLORIDE: 105 mmol/L (ref 98–107)
Calcium, Total: 9 mg/dL (ref 8.5–10.1)
Co2: 28 mmol/L (ref 21–32)
Creatinine: 1.29 mg/dL (ref 0.60–1.30)
EGFR (African American): >60
GFR CALC NON AF AMER: 53 — AB
GLUCOSE: 127 mg/dL — AB (ref 65–99)
OSMOLALITY: 278 (ref 275–301)
POTASSIUM: 3.9 mmol/L (ref 3.5–5.1)
SGOT(AST): 18 U/L (ref 15–37)
Sodium: 139 mmol/L (ref 136–145)
Total Protein: 8.6 g/dL — ABNORMAL HIGH (ref 6.4–8.2)

## 2013-07-17 LAB — TROPONIN I
TROPONIN-I: 0.22 ng/mL — AB
Troponin-I: 0.12 ng/mL — ABNORMAL HIGH
Troponin-I: 0.23 ng/mL — ABNORMAL HIGH

## 2013-07-17 LAB — LIPASE, BLOOD: LIPASE: 99 U/L (ref 73–393)

## 2013-07-17 LAB — APTT
ACTIVATED PTT: 37.9 s — AB (ref 23.6–35.9)
ACTIVATED PTT: 69.7 s — AB (ref 23.6–35.9)

## 2013-07-18 LAB — CBC WITH DIFFERENTIAL/PLATELET
Basophil #: 0 10*3/uL (ref 0.0–0.1)
Basophil %: 0.1 %
Eosinophil #: 0 10*3/uL (ref 0.0–0.7)
Eosinophil %: 0 %
HCT: 30.8 % — AB (ref 40.0–52.0)
HGB: 9.9 g/dL — ABNORMAL LOW (ref 13.0–18.0)
LYMPHS PCT: 26.5 %
Lymphocyte #: 0.6 10*3/uL — ABNORMAL LOW (ref 1.0–3.6)
MCH: 29 pg (ref 26.0–34.0)
MCHC: 32.1 g/dL (ref 32.0–36.0)
MCV: 90 fL (ref 80–100)
MONO ABS: 0.1 x10 3/mm — AB (ref 0.2–1.0)
Monocyte %: 3.7 %
NEUTROS ABS: 1.5 10*3/uL (ref 1.4–6.5)
Neutrophil %: 69.7 %
PLATELETS: 258 10*3/uL (ref 150–440)
RBC: 3.4 10*6/uL — ABNORMAL LOW (ref 4.40–5.90)
RDW: 15.5 % — ABNORMAL HIGH (ref 11.5–14.5)
WBC: 2.2 10*3/uL — ABNORMAL LOW (ref 3.8–10.6)

## 2013-07-18 LAB — TSH: THYROID STIMULATING HORM: 0.035 u[IU]/mL — AB

## 2013-07-18 LAB — LIPID PANEL
Cholesterol: 108 mg/dL (ref 0–200)
HDL: 36 mg/dL — AB (ref 40–60)
LDL CHOLESTEROL, CALC: 63 mg/dL (ref 0–100)
TRIGLYCERIDES: 45 mg/dL (ref 0–200)
VLDL Cholesterol, Calc: 9 mg/dL (ref 5–40)

## 2013-07-18 LAB — BASIC METABOLIC PANEL
Anion Gap: 5 — ABNORMAL LOW (ref 7–16)
BUN: 12 mg/dL (ref 7–18)
CHLORIDE: 108 mmol/L — AB (ref 98–107)
CREATININE: 1.19 mg/dL (ref 0.60–1.30)
Calcium, Total: 8.4 mg/dL — ABNORMAL LOW (ref 8.5–10.1)
Co2: 27 mmol/L (ref 21–32)
EGFR (African American): >60
EGFR (Non-African Amer.): 59 — ABNORMAL LOW
Glucose: 184 mg/dL — ABNORMAL HIGH (ref 65–99)
Osmolality: 284 (ref 275–301)
Potassium: 4.7 mmol/L (ref 3.5–5.1)
Sodium: 140 mmol/L (ref 136–145)

## 2013-07-18 LAB — APTT: ACTIVATED PTT: 83.2 s — AB (ref 23.6–35.9)

## 2013-07-18 LAB — MAGNESIUM: Magnesium: 1.9 mg/dL

## 2013-07-18 LAB — OCCULT BLOOD X 1 CARD TO LAB, STOOL: Occult Blood, Feces: NEGATIVE

## 2013-07-19 ENCOUNTER — Encounter: Payer: Self-pay | Admitting: Cardiovascular Disease

## 2013-07-19 DIAGNOSIS — I5020 Unspecified systolic (congestive) heart failure: Secondary | ICD-10-CM

## 2013-07-19 LAB — BASIC METABOLIC PANEL
Anion Gap: 4 — ABNORMAL LOW (ref 7–16)
BUN: 13 mg/dL (ref 7–18)
CHLORIDE: 108 mmol/L — AB (ref 98–107)
CO2: 28 mmol/L (ref 21–32)
Calcium, Total: 8.6 mg/dL (ref 8.5–10.1)
Creatinine: 1.04 mg/dL (ref 0.60–1.30)
EGFR (Non-African Amer.): >60
Glucose: 156 mg/dL — ABNORMAL HIGH (ref 65–99)
OSMOLALITY: 283 (ref 275–301)
POTASSIUM: 4.7 mmol/L (ref 3.5–5.1)
Sodium: 140 mmol/L (ref 136–145)

## 2013-07-19 LAB — CBC
HCT: 29.8 % — ABNORMAL LOW (ref 40.0–52.0)
HGB: 9.7 g/dL — AB (ref 13.0–18.0)
MCH: 28.9 pg (ref 26.0–34.0)
MCHC: 32.4 g/dL (ref 32.0–36.0)
MCV: 89 fL (ref 80–100)
Platelet: 285 10*3/uL (ref 150–440)
RBC: 3.34 10*6/uL — ABNORMAL LOW (ref 4.40–5.90)
RDW: 15.1 % — ABNORMAL HIGH (ref 11.5–14.5)
WBC: 6.6 10*3/uL (ref 3.8–10.6)

## 2013-07-19 LAB — T4, FREE: Free Thyroxine: 1.53 ng/dL — ABNORMAL HIGH (ref 0.76–1.46)

## 2013-07-20 LAB — BASIC METABOLIC PANEL
Anion Gap: 2 — ABNORMAL LOW (ref 7–16)
BUN: 20 mg/dL — AB (ref 7–18)
Calcium, Total: 8.3 mg/dL — ABNORMAL LOW (ref 8.5–10.1)
Chloride: 109 mmol/L — ABNORMAL HIGH (ref 98–107)
Co2: 30 mmol/L (ref 21–32)
Creatinine: 1.11 mg/dL (ref 0.60–1.30)
EGFR (Non-African Amer.): >60
Glucose: 100 mg/dL — ABNORMAL HIGH (ref 65–99)
OSMOLALITY: 284 (ref 275–301)
POTASSIUM: 4.6 mmol/L (ref 3.5–5.1)
SODIUM: 141 mmol/L (ref 136–145)

## 2013-08-05 LAB — URINALYSIS, COMPLETE
BILIRUBIN, UR: NEGATIVE
Bacteria: NONE SEEN
Glucose,UR: NEGATIVE mg/dL (ref 0–75)
LEUKOCYTE ESTERASE: NEGATIVE
NITRITE: NEGATIVE
Ph: 5 (ref 4.5–8.0)
RBC,UR: 1 /HPF (ref 0–5)
Specific Gravity: 1.021 (ref 1.003–1.030)
Squamous Epithelial: NONE SEEN
WBC UR: <1 /HPF (ref 0–5)

## 2013-08-05 LAB — CK TOTAL AND CKMB (NOT AT ARMC)
CK, Total: 36 U/L — ABNORMAL LOW
CK-MB: 0.6 ng/mL (ref 0.5–3.6)

## 2013-08-05 LAB — COMPREHENSIVE METABOLIC PANEL
Albumin: 2.4 g/dL — ABNORMAL LOW (ref 3.4–5.0)
Alkaline Phosphatase: 47 U/L
Anion Gap: 6 — ABNORMAL LOW (ref 7–16)
BUN: 14 mg/dL (ref 7–18)
Bilirubin,Total: 0.5 mg/dL (ref 0.2–1.0)
CALCIUM: 8.5 mg/dL (ref 8.5–10.1)
CHLORIDE: 107 mmol/L (ref 98–107)
CREATININE: 1.39 mg/dL — AB (ref 0.60–1.30)
Co2: 26 mmol/L (ref 21–32)
EGFR (African American): 57 — ABNORMAL LOW
EGFR (Non-African Amer.): 49 — ABNORMAL LOW
GLUCOSE: 136 mg/dL — AB (ref 65–99)
OSMOLALITY: 280 (ref 275–301)
Potassium: 4 mmol/L (ref 3.5–5.1)
SGOT(AST): 26 U/L (ref 15–37)
SGPT (ALT): 14 U/L (ref 12–78)
Sodium: 139 mmol/L (ref 136–145)
Total Protein: 8.1 g/dL (ref 6.4–8.2)

## 2013-08-05 LAB — CBC
HCT: 36.3 % — AB (ref 40.0–52.0)
HGB: 11.8 g/dL — ABNORMAL LOW (ref 13.0–18.0)
MCH: 29 pg (ref 26.0–34.0)
MCHC: 32.6 g/dL (ref 32.0–36.0)
MCV: 89 fL (ref 80–100)
Platelet: 253 10*3/uL (ref 150–440)
RBC: 4.07 10*6/uL — ABNORMAL LOW (ref 4.40–5.90)
RDW: 16.5 % — ABNORMAL HIGH (ref 11.5–14.5)
WBC: 8.8 10*3/uL (ref 3.8–10.6)

## 2013-08-05 LAB — TROPONIN I
Troponin-I: 0.07 ng/mL — ABNORMAL HIGH
Troponin-I: 0.07 ng/mL — ABNORMAL HIGH

## 2013-08-06 ENCOUNTER — Inpatient Hospital Stay: Payer: Self-pay | Admitting: Internal Medicine

## 2013-08-06 LAB — CK-MB
CK-MB: 0.8 ng/mL (ref 0.5–3.6)
CK-MB: 1.1 ng/mL (ref 0.5–3.6)

## 2013-08-06 LAB — TROPONIN I: Troponin-I: 0.05 ng/mL

## 2013-08-07 LAB — CBC WITH DIFFERENTIAL/PLATELET
Basophil #: 0 10*3/uL (ref 0.0–0.1)
Basophil %: 0.1 %
EOS PCT: 0 %
Eosinophil #: 0 10*3/uL (ref 0.0–0.7)
HCT: 28.7 % — ABNORMAL LOW (ref 40.0–52.0)
HGB: 9.5 g/dL — ABNORMAL LOW (ref 13.0–18.0)
LYMPHS ABS: 0.7 10*3/uL — AB (ref 1.0–3.6)
LYMPHS PCT: 8.3 %
MCH: 29.6 pg (ref 26.0–34.0)
MCHC: 33 g/dL (ref 32.0–36.0)
MCV: 90 fL (ref 80–100)
MONO ABS: 0.3 x10 3/mm (ref 0.2–1.0)
MONOS PCT: 4 %
NEUTROS PCT: 87.6 %
Neutrophil #: 7.2 10*3/uL — ABNORMAL HIGH (ref 1.4–6.5)
PLATELETS: 206 10*3/uL (ref 150–440)
RBC: 3.2 10*6/uL — ABNORMAL LOW (ref 4.40–5.90)
RDW: 16.2 % — ABNORMAL HIGH (ref 11.5–14.5)
WBC: 8.2 10*3/uL (ref 3.8–10.6)

## 2013-08-07 LAB — URINALYSIS, COMPLETE
Bilirubin,UR: NEGATIVE
Blood: NEGATIVE
Glucose,UR: 50 mg/dL (ref 0–75)
Hyaline Cast: 4
KETONE: NEGATIVE
LEUKOCYTE ESTERASE: NEGATIVE
Nitrite: NEGATIVE
PH: 5 (ref 4.5–8.0)
SPECIFIC GRAVITY: 1.032 (ref 1.003–1.030)
Squamous Epithelial: 1
WBC UR: 3 /HPF (ref 0–5)

## 2013-08-07 LAB — BASIC METABOLIC PANEL
ANION GAP: 7 (ref 7–16)
BUN: 14 mg/dL (ref 7–18)
CHLORIDE: 107 mmol/L (ref 98–107)
Calcium, Total: 8.2 mg/dL — ABNORMAL LOW (ref 8.5–10.1)
Co2: 26 mmol/L (ref 21–32)
Creatinine: 1.07 mg/dL (ref 0.60–1.30)
EGFR (African American): >60
EGFR (Non-African Amer.): >60
GLUCOSE: 130 mg/dL — AB (ref 65–99)
Osmolality: 282 (ref 275–301)
POTASSIUM: 4.3 mmol/L (ref 3.5–5.1)
Sodium: 140 mmol/L (ref 136–145)

## 2013-08-07 LAB — MAGNESIUM: MAGNESIUM: 2 mg/dL

## 2013-08-10 LAB — CULTURE, BLOOD (SINGLE)

## 2014-01-27 LAB — CBC
HCT: 41.4 % (ref 40.0–52.0)
HGB: 13.1 g/dL (ref 13.0–18.0)
MCH: 28.5 pg (ref 26.0–34.0)
MCHC: 31.5 g/dL — ABNORMAL LOW (ref 32.0–36.0)
MCV: 91 fL (ref 80–100)
Platelet: 184 10*3/uL (ref 150–440)
RBC: 4.58 10*6/uL (ref 4.40–5.90)
RDW: 17 % — ABNORMAL HIGH (ref 11.5–14.5)
WBC: 12.6 10*3/uL — ABNORMAL HIGH (ref 3.8–10.6)

## 2014-01-27 LAB — TROPONIN I: TROPONIN-I: 1.3 ng/mL — AB

## 2014-01-27 LAB — COMPREHENSIVE METABOLIC PANEL
ALBUMIN: 3.3 g/dL — AB (ref 3.4–5.0)
AST: 35 U/L (ref 15–37)
Alkaline Phosphatase: 56 U/L
Anion Gap: 13 (ref 7–16)
BUN: 26 mg/dL — AB (ref 7–18)
Bilirubin,Total: 0.6 mg/dL (ref 0.2–1.0)
Calcium, Total: 8.7 mg/dL (ref 8.5–10.1)
Chloride: 102 mmol/L (ref 98–107)
Co2: 25 mmol/L (ref 21–32)
Creatinine: 1.56 mg/dL — ABNORMAL HIGH (ref 0.60–1.30)
EGFR (African American): 56 — ABNORMAL LOW
GFR CALC NON AF AMER: 46 — AB
Glucose: 96 mg/dL (ref 65–99)
OSMOLALITY: 284 (ref 275–301)
Potassium: 4.8 mmol/L (ref 3.5–5.1)
SGPT (ALT): 18 U/L
SODIUM: 140 mmol/L (ref 136–145)
Total Protein: 8.4 g/dL — ABNORMAL HIGH (ref 6.4–8.2)

## 2014-01-27 LAB — CK TOTAL AND CKMB (NOT AT ARMC)
CK, Total: 222 U/L (ref 39–308)
CK-MB: 12.5 ng/mL — ABNORMAL HIGH (ref 0.5–3.6)

## 2014-01-28 ENCOUNTER — Inpatient Hospital Stay: Payer: Self-pay | Admitting: Internal Medicine

## 2014-01-28 LAB — PROTIME-INR
INR: 1.2
Prothrombin Time: 14.6 secs (ref 11.5–14.7)

## 2014-01-28 LAB — APTT: Activated PTT: 33.1 secs (ref 23.6–35.9)

## 2014-01-28 LAB — TROPONIN I
TROPONIN-I: 12 ng/mL — AB
Troponin-I: 11 ng/mL — ABNORMAL HIGH

## 2014-01-28 LAB — CK-MB
CK-MB: 54.4 ng/mL — AB (ref 0.5–3.6)
CK-MB: 63.1 ng/mL — AB (ref 0.5–3.6)

## 2014-01-29 LAB — LIPID PANEL
Cholesterol: 106 mg/dL (ref 0–200)
HDL Cholesterol: 56 mg/dL (ref 40–60)
Ldl Cholesterol, Calc: 40 mg/dL (ref 0–100)
Triglycerides: 49 mg/dL (ref 0–200)
VLDL Cholesterol, Calc: 10 mg/dL (ref 5–40)

## 2014-01-29 LAB — CBC WITH DIFFERENTIAL/PLATELET
Basophil #: 0 10*3/uL (ref 0.0–0.1)
Basophil %: 0 %
Eosinophil #: 0 10*3/uL (ref 0.0–0.7)
Eosinophil %: 0 %
HCT: 35.7 % — ABNORMAL LOW (ref 40.0–52.0)
HGB: 11.2 g/dL — ABNORMAL LOW (ref 13.0–18.0)
Lymphocyte #: 1.2 10*3/uL (ref 1.0–3.6)
Lymphocyte %: 8.7 %
MCH: 28.5 pg (ref 26.0–34.0)
MCHC: 31.4 g/dL — ABNORMAL LOW (ref 32.0–36.0)
MCV: 91 fL (ref 80–100)
Monocyte #: 1.8 x10 3/mm — ABNORMAL HIGH (ref 0.2–1.0)
Monocyte %: 13.3 %
Neutrophil #: 10.7 10*3/uL — ABNORMAL HIGH (ref 1.4–6.5)
Neutrophil %: 78 %
Platelet: 166 10*3/uL (ref 150–440)
RBC: 3.94 10*6/uL — ABNORMAL LOW (ref 4.40–5.90)
RDW: 16.7 % — ABNORMAL HIGH (ref 11.5–14.5)
WBC: 13.8 10*3/uL — ABNORMAL HIGH (ref 3.8–10.6)

## 2014-01-29 LAB — TSH: Thyroid Stimulating Horm: 0.295 u[IU]/mL — ABNORMAL LOW

## 2014-01-29 LAB — HEMOGLOBIN A1C: Hemoglobin A1C: 6.5 % — ABNORMAL HIGH (ref 4.2–6.3)

## 2014-01-30 LAB — BASIC METABOLIC PANEL
Anion Gap: 8 (ref 7–16)
BUN: 17 mg/dL (ref 7–18)
CHLORIDE: 105 mmol/L (ref 98–107)
Calcium, Total: 9.1 mg/dL (ref 8.5–10.1)
Co2: 26 mmol/L (ref 21–32)
Creatinine: 1.09 mg/dL (ref 0.60–1.30)
EGFR (African American): >60
EGFR (Non-African Amer.): >60
Glucose: 106 mg/dL — ABNORMAL HIGH (ref 65–99)
Osmolality: 280 (ref 275–301)
POTASSIUM: 4.1 mmol/L (ref 3.5–5.1)
SODIUM: 139 mmol/L (ref 136–145)

## 2014-01-30 LAB — CBC WITH DIFFERENTIAL/PLATELET
BASOS ABS: 0 10*3/uL (ref 0.0–0.1)
BASOS PCT: 0.1 %
EOS ABS: 0.1 10*3/uL (ref 0.0–0.7)
Eosinophil %: 1 %
HCT: 39 % — ABNORMAL LOW (ref 40.0–52.0)
HGB: 12.4 g/dL — ABNORMAL LOW (ref 13.0–18.0)
LYMPHS PCT: 13.5 %
Lymphocyte #: 1.5 10*3/uL (ref 1.0–3.6)
MCH: 28.5 pg (ref 26.0–34.0)
MCHC: 31.8 g/dL — AB (ref 32.0–36.0)
MCV: 90 fL (ref 80–100)
MONOS PCT: 14.4 %
Monocyte #: 1.7 x10 3/mm — ABNORMAL HIGH (ref 0.2–1.0)
NEUTROS PCT: 71 %
Neutrophil #: 8.1 10*3/uL — ABNORMAL HIGH (ref 1.4–6.5)
PLATELETS: 180 10*3/uL (ref 150–440)
RBC: 4.35 10*6/uL — ABNORMAL LOW (ref 4.40–5.90)
RDW: 16.7 % — ABNORMAL HIGH (ref 11.5–14.5)
WBC: 11.5 10*3/uL — ABNORMAL HIGH (ref 3.8–10.6)

## 2014-01-30 LAB — T4, FREE: FREE THYROXINE: 1.63 ng/dL — AB (ref 0.76–1.46)

## 2014-02-01 LAB — CULTURE, BLOOD (SINGLE)

## 2014-02-05 ENCOUNTER — Emergency Department: Payer: Self-pay | Admitting: Emergency Medicine

## 2014-02-05 LAB — CBC
HCT: 39.9 % — ABNORMAL LOW (ref 40.0–52.0)
HGB: 13 g/dL (ref 13.0–18.0)
MCH: 29.1 pg (ref 26.0–34.0)
MCHC: 32.6 g/dL (ref 32.0–36.0)
MCV: 90 fL (ref 80–100)
Platelet: 257 10*3/uL (ref 150–440)
RBC: 4.46 10*6/uL (ref 4.40–5.90)
RDW: 16.5 % — ABNORMAL HIGH (ref 11.5–14.5)
WBC: 10.5 10*3/uL (ref 3.8–10.6)

## 2014-02-05 LAB — BASIC METABOLIC PANEL
ANION GAP: 8 (ref 7–16)
BUN: 28 mg/dL — AB (ref 7–18)
CHLORIDE: 100 mmol/L (ref 98–107)
CO2: 28 mmol/L (ref 21–32)
CREATININE: 1.48 mg/dL — AB (ref 0.60–1.30)
Calcium, Total: 9.1 mg/dL (ref 8.5–10.1)
EGFR (African American): 60 — ABNORMAL LOW
GFR CALC NON AF AMER: 49 — AB
GLUCOSE: 99 mg/dL (ref 65–99)
Osmolality: 277 (ref 275–301)
Potassium: 4.8 mmol/L (ref 3.5–5.1)
Sodium: 136 mmol/L (ref 136–145)

## 2014-02-05 LAB — TROPONIN I: TROPONIN-I: 0.1 ng/mL — AB

## 2014-02-13 ENCOUNTER — Inpatient Hospital Stay: Payer: Self-pay | Admitting: Internal Medicine

## 2014-02-13 LAB — CBC WITH DIFFERENTIAL/PLATELET
BANDS NEUTROPHIL: 3 %
COMMENT - H1-COM1: NORMAL
Comment - H1-Com2: NORMAL
HCT: 44.3 % (ref 40.0–52.0)
HGB: 13.9 g/dL (ref 13.0–18.0)
Lymphocytes: 3 %
MCH: 28.3 pg (ref 26.0–34.0)
MCHC: 31.3 g/dL — ABNORMAL LOW (ref 32.0–36.0)
MCV: 90 fL (ref 80–100)
Monocytes: 6 %
Platelet: 362 10*3/uL (ref 150–440)
RBC: 4.9 10*6/uL (ref 4.40–5.90)
RDW: 16.8 % — ABNORMAL HIGH (ref 11.5–14.5)
Segmented Neutrophils: 88 %
WBC: 38.3 10*3/uL — AB (ref 3.8–10.6)

## 2014-02-13 LAB — COMPREHENSIVE METABOLIC PANEL
ANION GAP: 9 (ref 7–16)
Albumin: 2.7 g/dL — ABNORMAL LOW (ref 3.4–5.0)
Alkaline Phosphatase: 69 U/L
BUN: 39 mg/dL — ABNORMAL HIGH (ref 7–18)
Bilirubin,Total: 1 mg/dL (ref 0.2–1.0)
CALCIUM: 9.3 mg/dL (ref 8.5–10.1)
CO2: 27 mmol/L (ref 21–32)
CREATININE: 2.08 mg/dL — AB (ref 0.60–1.30)
Chloride: 100 mmol/L (ref 98–107)
EGFR (Non-African Amer.): 33 — ABNORMAL LOW
GFR CALC AF AMER: 40 — AB
GLUCOSE: 169 mg/dL — AB (ref 65–99)
OSMOLALITY: 285 (ref 275–301)
Potassium: 5.4 mmol/L — ABNORMAL HIGH (ref 3.5–5.1)
SGOT(AST): 28 U/L (ref 15–37)
SGPT (ALT): 33 U/L
Sodium: 136 mmol/L (ref 136–145)
TOTAL PROTEIN: 8.5 g/dL — AB (ref 6.4–8.2)

## 2014-02-13 LAB — URINALYSIS, COMPLETE
Bacteria: NONE SEEN
Bilirubin,UR: NEGATIVE
Blood: NEGATIVE
Glucose,UR: NEGATIVE mg/dL (ref 0–75)
Leukocyte Esterase: NEGATIVE
Nitrite: NEGATIVE
PH: 6 (ref 4.5–8.0)
Protein: 30
RBC,UR: <1 /HPF (ref 0–5)
SPECIFIC GRAVITY: 1.019 (ref 1.003–1.030)
Squamous Epithelial: 1
WBC UR: 1 /HPF (ref 0–5)

## 2014-02-13 LAB — LIPASE, BLOOD: Lipase: 34 U/L — ABNORMAL LOW (ref 73–393)

## 2014-02-13 LAB — PROTIME-INR
INR: 1.3
Prothrombin Time: 15.5 secs — ABNORMAL HIGH (ref 11.5–14.7)

## 2014-02-13 LAB — MAGNESIUM: Magnesium: 3 mg/dL — ABNORMAL HIGH

## 2014-02-13 LAB — TROPONIN I: Troponin-I: 0.05 ng/mL

## 2014-02-13 LAB — PHOSPHORUS: Phosphorus: 4.4 mg/dL (ref 2.5–4.9)

## 2014-02-14 LAB — CBC WITH DIFFERENTIAL/PLATELET
Basophil #: 0 10*3/uL (ref 0.0–0.1)
Basophil %: 0.1 %
Eosinophil #: 0 10*3/uL (ref 0.0–0.7)
Eosinophil %: 0 %
HCT: 41.5 % (ref 40.0–52.0)
HGB: 13 g/dL (ref 13.0–18.0)
LYMPHS ABS: 0.8 10*3/uL — AB (ref 1.0–3.6)
Lymphocyte %: 3.1 %
MCH: 28.5 pg (ref 26.0–34.0)
MCHC: 31.4 g/dL — ABNORMAL LOW (ref 32.0–36.0)
MCV: 91 fL (ref 80–100)
Monocyte #: 1 x10 3/mm (ref 0.2–1.0)
Monocyte %: 3.8 %
NEUTROS PCT: 93 %
Neutrophil #: 24.3 10*3/uL — ABNORMAL HIGH (ref 1.4–6.5)
Platelet: 282 10*3/uL (ref 150–440)
RBC: 4.58 10*6/uL (ref 4.40–5.90)
RDW: 16.8 % — ABNORMAL HIGH (ref 11.5–14.5)
WBC: 26.1 10*3/uL — ABNORMAL HIGH (ref 3.8–10.6)

## 2014-02-14 LAB — BASIC METABOLIC PANEL
ANION GAP: 11 (ref 7–16)
BUN: 46 mg/dL — ABNORMAL HIGH (ref 7–18)
CO2: 27 mmol/L (ref 21–32)
CREATININE: 2.2 mg/dL — AB (ref 0.60–1.30)
Calcium, Total: 8.3 mg/dL — ABNORMAL LOW (ref 8.5–10.1)
Chloride: 98 mmol/L (ref 98–107)
EGFR (African American): 38 — ABNORMAL LOW
GFR CALC NON AF AMER: 31 — AB
Glucose: 206 mg/dL — ABNORMAL HIGH (ref 65–99)
OSMOLALITY: 290 (ref 275–301)
Potassium: 4.7 mmol/L (ref 3.5–5.1)
SODIUM: 136 mmol/L (ref 136–145)

## 2014-02-15 LAB — CBC WITH DIFFERENTIAL/PLATELET
Basophil #: 0 10*3/uL (ref 0.0–0.1)
Basophil %: 0 %
EOS PCT: 0.1 %
Eosinophil #: 0 10*3/uL (ref 0.0–0.7)
HCT: 39.4 % — ABNORMAL LOW (ref 40.0–52.0)
HGB: 12.5 g/dL — ABNORMAL LOW (ref 13.0–18.0)
LYMPHS ABS: 0.7 10*3/uL — AB (ref 1.0–3.6)
LYMPHS PCT: 2.7 %
MCH: 28.7 pg (ref 26.0–34.0)
MCHC: 31.8 g/dL — AB (ref 32.0–36.0)
MCV: 90 fL (ref 80–100)
Monocyte #: 2.1 x10 3/mm — ABNORMAL HIGH (ref 0.2–1.0)
Monocyte %: 8.5 %
NEUTROS ABS: 21.7 10*3/uL — AB (ref 1.4–6.5)
Neutrophil %: 88.7 %
Platelet: 248 10*3/uL (ref 150–440)
RBC: 4.37 10*6/uL — AB (ref 4.40–5.90)
RDW: 16.7 % — AB (ref 11.5–14.5)
WBC: 24.5 10*3/uL — AB (ref 3.8–10.6)

## 2014-02-15 LAB — BASIC METABOLIC PANEL
Anion Gap: 9 (ref 7–16)
BUN: 33 mg/dL — ABNORMAL HIGH (ref 7–18)
CO2: 22 mmol/L (ref 21–32)
CREATININE: 1.28 mg/dL (ref 0.60–1.30)
Calcium, Total: 8.1 mg/dL — ABNORMAL LOW (ref 8.5–10.1)
Chloride: 108 mmol/L — ABNORMAL HIGH (ref 98–107)
GFR CALC NON AF AMER: 58 — AB
Glucose: 118 mg/dL — ABNORMAL HIGH (ref 65–99)
Osmolality: 286 (ref 275–301)
Potassium: 5.6 mmol/L — ABNORMAL HIGH (ref 3.5–5.1)
SODIUM: 139 mmol/L (ref 136–145)

## 2014-02-15 LAB — URINE CULTURE

## 2014-02-16 LAB — CBC WITH DIFFERENTIAL/PLATELET
BASOS ABS: 0 10*3/uL (ref 0.0–0.1)
Basophil %: 0.1 %
Eosinophil #: 0 10*3/uL (ref 0.0–0.7)
Eosinophil %: 0.2 %
HCT: 40.5 % (ref 40.0–52.0)
HGB: 12.6 g/dL — AB (ref 13.0–18.0)
Lymphocyte #: 0.9 10*3/uL — ABNORMAL LOW (ref 1.0–3.6)
Lymphocyte %: 5.8 %
MCH: 28.4 pg (ref 26.0–34.0)
MCHC: 31.2 g/dL — ABNORMAL LOW (ref 32.0–36.0)
MCV: 91 fL (ref 80–100)
MONO ABS: 1.4 x10 3/mm — AB (ref 0.2–1.0)
Monocyte %: 9.6 %
NEUTROS PCT: 84.3 %
Neutrophil #: 12.7 10*3/uL — ABNORMAL HIGH (ref 1.4–6.5)
Platelet: 262 10*3/uL (ref 150–440)
RBC: 4.45 10*6/uL (ref 4.40–5.90)
RDW: 17 % — ABNORMAL HIGH (ref 11.5–14.5)
WBC: 15 10*3/uL — AB (ref 3.8–10.6)

## 2014-02-16 LAB — BASIC METABOLIC PANEL
ANION GAP: 5 — AB (ref 7–16)
BUN: 23 mg/dL — ABNORMAL HIGH (ref 7–18)
Calcium, Total: 8.7 mg/dL (ref 8.5–10.1)
Chloride: 106 mmol/L (ref 98–107)
Co2: 30 mmol/L (ref 21–32)
Creatinine: 1.29 mg/dL (ref 0.60–1.30)
EGFR (African American): >60
EGFR (Non-African Amer.): 58 — ABNORMAL LOW
Glucose: 126 mg/dL — ABNORMAL HIGH (ref 65–99)
OSMOLALITY: 286 (ref 275–301)
Potassium: 4.4 mmol/L (ref 3.5–5.1)
SODIUM: 141 mmol/L (ref 136–145)

## 2014-02-16 LAB — URINE CULTURE

## 2014-02-18 LAB — CULTURE, BLOOD (SINGLE)

## 2014-02-21 ENCOUNTER — Ambulatory Visit: Payer: Self-pay | Admitting: Internal Medicine

## 2014-03-18 ENCOUNTER — Inpatient Hospital Stay: Payer: Self-pay | Admitting: Specialist

## 2014-03-18 LAB — COMPREHENSIVE METABOLIC PANEL
ANION GAP: 7 (ref 7–16)
Albumin: 2.1 g/dL — ABNORMAL LOW (ref 3.4–5.0)
Alkaline Phosphatase: 52 U/L (ref 46–116)
BILIRUBIN TOTAL: 0.3 mg/dL (ref 0.2–1.0)
BUN: 23 mg/dL — ABNORMAL HIGH (ref 7–18)
CO2: 29 mmol/L (ref 21–32)
CREATININE: 1.32 mg/dL — AB (ref 0.60–1.30)
Calcium, Total: 8.3 mg/dL — ABNORMAL LOW (ref 8.5–10.1)
Chloride: 101 mmol/L (ref 98–107)
GFR CALC NON AF AMER: 56 — AB
Glucose: 154 mg/dL — ABNORMAL HIGH (ref 65–99)
OSMOLALITY: 281 (ref 275–301)
POTASSIUM: 4.8 mmol/L (ref 3.5–5.1)
SGOT(AST): 19 U/L (ref 15–37)
SGPT (ALT): 9 U/L — ABNORMAL LOW (ref 14–63)
SODIUM: 137 mmol/L (ref 136–145)
Total Protein: 7.8 g/dL (ref 6.4–8.2)

## 2014-03-18 LAB — CBC
HCT: 40.5 % (ref 40.0–52.0)
HGB: 12.9 g/dL — AB (ref 13.0–18.0)
MCH: 28.3 pg (ref 26.0–34.0)
MCHC: 31.7 g/dL — ABNORMAL LOW (ref 32.0–36.0)
MCV: 89 fL (ref 80–100)
PLATELETS: 335 10*3/uL (ref 150–440)
RBC: 4.54 10*6/uL (ref 4.40–5.90)
RDW: 15.5 % — ABNORMAL HIGH (ref 11.5–14.5)
WBC: 26.2 10*3/uL — ABNORMAL HIGH (ref 3.8–10.6)

## 2014-03-18 LAB — URINALYSIS, COMPLETE
BILIRUBIN, UR: NEGATIVE
BLOOD: NEGATIVE
Bacteria: NONE SEEN
GLUCOSE, UR: NEGATIVE mg/dL (ref 0–75)
Ketone: NEGATIVE
Leukocyte Esterase: NEGATIVE
Nitrite: NEGATIVE
Ph: 7 (ref 4.5–8.0)
Protein: NEGATIVE
RBC,UR: <1 /HPF (ref 0–5)
Specific Gravity: 1.015 (ref 1.003–1.030)
WBC UR: 1 /HPF (ref 0–5)

## 2014-03-18 LAB — PROTIME-INR
INR: 1.1
Prothrombin Time: 13.6 secs (ref 11.5–14.7)

## 2014-03-18 LAB — TROPONIN I: Troponin-I: 0.02 ng/mL

## 2014-03-19 LAB — CBC WITH DIFFERENTIAL/PLATELET
BASOS ABS: 0 10*3/uL (ref 0.0–0.1)
Basophil %: 0.1 %
EOS ABS: 0 10*3/uL (ref 0.0–0.7)
Eosinophil %: 0 %
HCT: 31.2 % — ABNORMAL LOW (ref 40.0–52.0)
HGB: 9.9 g/dL — ABNORMAL LOW (ref 13.0–18.0)
LYMPHS ABS: 1.2 10*3/uL (ref 1.0–3.6)
LYMPHS PCT: 4.8 %
MCH: 28.4 pg (ref 26.0–34.0)
MCHC: 31.8 g/dL — AB (ref 32.0–36.0)
MCV: 89 fL (ref 80–100)
MONOS PCT: 2.5 %
Monocyte #: 0.7 x10 3/mm (ref 0.2–1.0)
NEUTROS ABS: 24.1 10*3/uL — AB (ref 1.4–6.5)
Neutrophil %: 92.6 %
PLATELETS: 233 10*3/uL (ref 150–440)
RBC: 3.5 10*6/uL — ABNORMAL LOW (ref 4.40–5.90)
RDW: 15.7 % — ABNORMAL HIGH (ref 11.5–14.5)
WBC: 26 10*3/uL — AB (ref 3.8–10.6)

## 2014-03-19 LAB — BASIC METABOLIC PANEL
ANION GAP: 9 (ref 7–16)
BUN: 25 mg/dL — ABNORMAL HIGH (ref 7–18)
Calcium, Total: 8.5 mg/dL (ref 8.5–10.1)
Chloride: 103 mmol/L (ref 98–107)
Co2: 29 mmol/L (ref 21–32)
Creatinine: 1.22 mg/dL (ref 0.60–1.30)
GLUCOSE: 140 mg/dL — AB (ref 65–99)
OSMOLALITY: 288 (ref 275–301)
Potassium: 4.5 mmol/L (ref 3.5–5.1)
Sodium: 141 mmol/L (ref 136–145)

## 2014-03-21 LAB — URINE CULTURE

## 2014-03-23 LAB — CULTURE, BLOOD (SINGLE)

## 2014-03-24 ENCOUNTER — Ambulatory Visit: Payer: Self-pay | Admitting: Internal Medicine

## 2014-04-15 ENCOUNTER — Inpatient Hospital Stay: Payer: Self-pay | Admitting: Internal Medicine

## 2014-04-20 ENCOUNTER — Inpatient Hospital Stay: Payer: Self-pay | Admitting: Internal Medicine

## 2014-04-22 ENCOUNTER — Ambulatory Visit
Admit: 2014-04-22 | Disposition: A | Discharge status: Home / Self Care | Payer: Self-pay | Attending: Internal Medicine | Admitting: Internal Medicine

## 2014-05-23 DEATH — deceased

## 2014-06-13 NOTE — H&P (Signed)
PATIENT NAME:  Colton Ruiz, Colton Ruiz MR#:  [id number] DATE OF BIRTH:  [birth year]  DATE OF ADMISSION:  05/02/2012   NO DICTATION  ____________________________ Hope PigeonVaibhavkumar G. Elisabeth PigeonVachhani, MD vgv:cc D: 05/02/2012 22:42:41 ET T: 05/02/2012 23:20:30 ET JOB#: [id number]  cc: Hope PigeonVaibhavkumar G. Elisabeth PigeonVachhani, MD, <Dictator> Altamese DillingVAIBHAVKUMAR VACHHANI MD ELECTRONICALLY SIGNED 05/21/2012 9:35

## 2014-06-13 NOTE — H&P (Signed)
PATIENT NAME:  Colton Ruiz, Colton Ruiz MR#:  [id number] DATE OF BIRTH:  Jul 26, 1935  DATE OF ADMISSION:  05/02/2012  PRIMARY CARE PHYSICIAN:  In [place].   CHIEF COMPLAINT:  Hemoptysis.   HISTORY OF PRESENT ILLNESS:  The patient is a 79 year old male with past medical history of diabetes, hypertension, hyperlipidemia, chronic obstructive pulmonary disease and history of prostate cancer and tuberculosis in the remote past.  He is not a good historian and he says that he started having hemoptysis for the last 1 to 2 days and that is why he came to the Emergency Room.  He also stated that he has some dry cough, but denies any fever or weight loss.  He says that he has some night sweats, but that is also for a few days.  The hemoptysis he noticed is like small spoonful size 1 to 2 times total until now, one time in ER also.  The sample is there in the room.  Because of this suspicious history and history of positive tuberculosis in the past he was placed on airborne isolation by ER and was given for admission for further work-up and diagnosis.   REVIEW OF SYSTEMS:  CONSTITUTIONAL:  Negative for fever, fatigue, weakness, weight loss.  EYES:  No blurring, double vision or any redness in the eyes.  EARS, NOSE, THROAT:  No tinnitus, ear pain or hearing loss.  RESPIRATORY:  No wheezing or shortness of breath, but has some cough and hemoptysis.  CARDIOVASCULAR:  No chest pain, orthopnea, palpitation or dizziness.  GASTROINTESTINAL:  No nausea, vomiting, diarrhea, abdominal pain.  GENITOURINARY:  No dysuria, hematuria, increased frequency.  ENDOCRINOLOGY:  No heat or cold intolerance.  No nocturia MUSCULOSKELETAL:  No swelling or pain on the joints.  SKIN:  No rashes or acne.  NEUROLOGICAL:  No numbness, weakness, dysarthria, tremors or headache.  PSYCHIATRIC:  No anxiety, insomnia or depression.   SOCIAL HISTORY:  He used to be a smoker.  He quit 7 to 8 years ago at about 30 to 40 pack-years smoking.  Also used  to drink, quit a few years ago.  No illicit drug use.  He was in jail as a prisoner and released five years ago.  He is currently working as a Visual merchandiser.   FAMILY HISTORY:  He cannot recall his family history presently and he is a poor historian.   MEDICATIONS:  He says that he is not seeing any doctor, but he has all the prescriptions and on the bottles there are 2 doctors names written, one is Dr. Dorothey Baseman and the other is Dr. Denita Lung and pharmacy is [facility], [facility] [place].  [facility] [phone].  His home medication:  Lisinopril 5 mg daily, vitamin D2 1.25 mg daily, pantoprazole 40 mg daily, Combivent, ProAir and Advair inhalers.   PHYSICAL EXAMINATION: VITAL SIGNS:  In ER, temperature 98.1, pulse rate 82, respirations 18, blood pressure 109/73, pulse ox 96 on room air.  GENERAL:  Fully alert and oriented to his time, place and person, but he is somehow poor historian and not answering properly the questions.  He is not in any acute distress.  HEENT:  Head and neck atraumatic.  Conjunctivae pink.  Oral mucosa moist.  NECK:  Supple.  No JVD.  RESPIRATORY:  Bilateral equal air entry, few creps present all over both chest fields, but no extensive wheezing heard.  CARDIOVASCULAR:  S1, S2 present, regular.  No murmur appreciated.  ABDOMEN:  Soft, nontender.  Bowel  sounds present.  No organomegaly.  SKIN:  No rashes.  JOINTS:  No swelling or no tenderness.  LEGS:  No edema.  NEUROLOGICAL:  Power 5 out of 5 all four limbs.  No tremors.  Follows commands.   LABORATORY DATA:  Glucose 140, BUN 20, creatinine 1.43, sodium 141, potassium 3.7, chloride 111, CO2 24, total protein 8.5, albumin 3.4, bilirubin 0.2, alkaline phosphatase 52, SGOT 32, SGPT 20.  CK total 305, troponin 0.02.  WBC 6.5, hemoglobin 12.5, platelet count 168, MCV 86.  Impression, on chest CT, stable finding within the chest and abdomen when compared with the previous study of April 2013.  End stage  emphysematous changes are appreciated, multiple calcified nodules throughout the lungs are identified, may be present the sequelae of prior granulomatous disease and etiology such as  infectious pulmonary hemorrhage, calcified metastatic disease if clinically appropriate cannot be excluded.   ASSESSMENT AND PLAN:  A 79 year old male with past medical history of emphysema and tuberculosis, history of hypertension and hyperlipidemia, but not on any good medications for those things presented to the Emergency Room with hemoptysis and some cough for a few days. 1.  History of tuberculosis and presented with hemoptysis.  We will put him under observation.  Sputum for AFB.  ID consult.  No antibiotics at this time as it might alter with the findings on sputum AFB, airborne isolation.   2.  Emphysema, chronic obstructive pulmonary disease.  He is on Advair and ProAir, currently stable.  No wheezing, so we will continue the same.  3.  Acute renal failure.  We will give some IV fluids and follow BMP tomorrow.  4.  He says that he has history of hyperlipidemia, but not taking any medication.  We will check his lipid panel tomorrow morning  5.  Deep vein thrombosis prophylaxis.  The patient is ambulatory and we will not give any anticoagulation as he has hemoptysis.  6.  GI prophylaxis with Protonix.   TOTAL TIME SPENT:  50 minutes.   CODE STATUS:  FULL CODE.   ____________________________ Hope PigeonVaibhavkumar G. Elisabeth PigeonVachhani, MD vgv:ea D: 05/02/2012 22:42:32 ET T: 05/03/2012 00:09:16 ET JOB#: [id number]  cc: Hope PigeonVaibhavkumar G. Elisabeth PigeonVachhani, MD, <Dictator> Altamese DillingVAIBHAVKUMAR VACHHANI MD ELECTRONICALLY SIGNED 05/21/2012 9:36

## 2014-06-13 NOTE — Consult Note (Signed)
PATIENT NAME:  Colton Ruiz, Colton Ruiz MR#:  [id number] DATE OF BIRTH:  [birth year]  DATE OF CONSULTATION:  05/03/2012  REFERRING PHYSICIAN:  Dr. Manuella Ruiz CONSULTING PHYSICIAN:  Colton Ruiz  REASON FOR CONSULTATION: Hemoptysis.  HISTORY OF PRESENT ILLNESS: The patient is a 79 year old male with a past history significant for diabetes and active pulmonary TB approximately 15 years ago who was admitted with a 1-day history of hemoptysis. The patient states he had been doing fairly well, although for the last week he had been having some night sweats. He began having hemoptysis on the day of admission and was brought to the Emergency Room and subsequently admitted. He has been placed on airborne isolation, although no sputums have been sent at this time. He denies any fevers or chills. He has not had any weight loss. He has had no sick contacts. He does not smoke. The patient had been diagnosed with tuberculosis at [facility] approximately 15 years ago. He states that he took pills for this but could not tell me how many, which medications or for how long. He was not followed at the health department, but actually was seen in the ambulatory care center at [facility].   ALLERGIES: PERCOCET.   PAST MEDICAL HISTORY: 1.  Diabetes.  2.  Pulmonary tuberculosis status post treatment of uncertain duration.  3.  Hypertension.  4.  Hypercholesterolemia.  5.  COPD.  6.  Prostate cancer.   SOCIAL HISTORY: The patient lives with his cousin. He is a prior smoker having quit around the time he was diagnosed with tuberculosis. He is a prior drinker, but quit around the time he was diagnosed with tuberculosis. He does not use any injecting drugs.  He has a history of being in prison approximately 5 years ago.   FAMILY HISTORY: The patient does not recall any history.   REVIEW OF SYSTEMS:   GENERAL: No fever or chills. Positive night sweats for approximately 1 week. No weight loss. Some malaise. No fatigue.  HEENT: No headaches.  No sinus congestion. No sore throat.  NECK: No stiffness. No swollen glands.  RESPIRATORY: Positive cough with hemoptysis. No significant shortness of breath. No wheezing.  CARDIAC: No chest pains or palpitations. No peripheral edema.  GASTROINTESTINAL: No nausea, no vomiting, no abdominal pain and no change in his bowels.  GENITOURINARY: No change in his urine.  MUSCULOSKELETAL: No joint or muscle complaints.  SKIN: No rashes.  NEUROLOGIC: No focal weakness.  PSYCHIATRIC: No complaints.   PHYSICAL EXAMINATION: VITAL SIGNS: T-max 98.7, T-current 97.6, pulse 88, blood pressure 127/70, 97% on room air.  GENERAL: A 79 year old black man in no acute distress.  HEENT: Normocephalic, atraumatic. Pupils equal and reactive to light. Extraocular motion intact. Sclerae, conjunctivae and lids without evidence for emboli or petechiae. Oropharynx shows no erythema or exudate. Teeth and gums are in fair condition.  NECK: Supple. Full range of motion. Midline trachea. No lymphadenopathy. No thyromegaly.  LUNGS: Clear to auscultation bilaterally with good air movement. No focal consolidation.  HEART: Regular rate and rhythm without murmur, rub or gallop.  ABDOMEN: Soft, nontender and nondistended. No hepatosplenomegaly. No hernia is noted.  EXTREMITIES: No evidence for tenosynovitis.  SKIN: No rashes. No stigmata of endocarditis, specifically no Janeway lesions nor Osler nodes.  NEUROLOGIC: The patient was awake and interactive. He was moving all 4 extremities.  PSYCHIATRIC: Mood and affect appeared normal.   LABORATORY AND DIAGNOSTIC DATA: BUN 15, creatinine 1.27, bicarbonate 24 and anion gap 7. LFTs were unremarkable.  White count 6.1, hemoglobin 12.0, platelet count 152 and ANC 2.9. White count on admission was 6.5.   He had a chest x-ray which showed chronic interstitial findings without focal consolidation. There were chronic nodular densities and biapical pleural capping noted. A CT of the chest  without contrast showed end-stage emphysematous changes and multiple calcified nodules throughout both lungs suggestive of sequela of prior granulomatous disease, although pulmonary hemorrhage was still a possibility.   IMPRESSION: A 80 year old male with past history significant for diabetes and pulmonary tuberculosis about 15 years ago who was admitted with hemoptysis.  RECOMMENDATIONS:  1.  He had pulmonary tuberculosis about 15 years ago. He states he was treated but I cannot get exactly how long he took the medications for. He did not go through the health department. He has only had hemoptysis for 1 day, but he has had night sweats for about a week. He has not lost any weight and CT did not show any obvious cavitary disease.  2.  I agree with airborne isolation and sending sputums for AFB. So far no sputums have been sent.  He actively having hemoptysis. Discussed this with nursing and will get the  sputums.  3.  A more common cause of hemoptysis would be bronchitis. We will send routine sputum as well.  4.  We will start levofloxacin at 750 mg p.o. daily x 5 days. Will send a HIV test as well as ANCA, ANA, ESR and anti-glomerular basement membrane.  This is a moderately complex infectious disease consult. Thank you very much for involving me in Colton Ruiz's care. ____________________________ Colton Ruiz meb:sb D: 05/03/2012 14:28:32 ET T: 05/03/2012 14:52:15 ET JOB#: [id number]  cc: Colton Ruiz, <Dictator> Colton Ruiz ELECTRONICALLY SIGNED 05/04/2012 14:12

## 2014-06-13 NOTE — Discharge Summary (Signed)
PATIENT NAME:  Colton Ruiz, Colton Ruiz MR#:  [id number] DATE OF BIRTH:  1935/04/18  DISCHARGE DIAGNOSES: 1.  Hemoptysis secondary to acute bronchitis. Sputum cultures negative for tuberculosis.  2.  Chronic obstructive pulmonary disease.  3.  Acute renal failure, resolved.   CONSULTATIONS: ID consult with Dr. Clayborn Bigness.   DISCHARGE MEDICATIONS:  1.  Advair 250/50 one puff b.i.d.  2.  Vitamin D2 50,000 units once a week.  3.  Lisinopril 5 mg daily.  4.  ProAir 2 puffs 4 times daily as needed for trouble breathing.  5.  Protonix 40 mg p.o. daily.  5.  Combivent 1 puff 4 times daily.  6.  Bactrim 1 tablet p.o. q. 12 hours for 10 days.   HOSPITAL COURSE:  1.  The patient is a 79 year old male with history of COPD and hypertension admitted on March 12 because of hemoptysis. Look at the history and physical for full details. The patient did not have any weight loss, but had some night sweats and had hemoptysis for about 2 days before he came. He had a history of TB before. He was admitted to the hospitalist service and placed under airborne precautions. Sputum AFBs were sent, which were negative x3. He was also started on Levaquin for his possible bronchitis. Chest x-ray showed chronic interstitial changes. The patient also had a CT of the chest, which showed end-stage emphysematous changes, multiple calcified nodules throughout the lungs are identified which represent prior granulomatous disease, such as infection or pulmonary hemorrhage. The patient's initial white count was 6.5, hemoglobin 12.3, hematocrit 38, platelets 168, BUN 20, creatinine 1.43. The patient improved on Levaquin, but the sputum culture showed heavy growth of serratia, which is resistant to Levaquin. Sputum cultures also are positive for heavy growth of staph. This was done on March 14. So antibiotics were changed from Levaquin to Bactrim, and the patient right now will go home because his AFBs are negative x3. He can continue Bactrim for 10  days as discussed with Dr. Clayborn Bigness.  2.  Acute renal failure. The patient is on lisinopril at home. He had a BUN of 20, creatinine 1.43 on admission. After giving fluids, his BUN and creatinine improved, BUN 15 and creatinine 1.27 on March 13. The patient can resume his lisinopril when he goes home.  3.  COPD. Had no wheezing. The patient's O2 sats were around 95% to 94% on room air. Will check the oxygen saturation on exertion as well, to see if he qualifies for home oxygen, and he quit smoking years ago.  4.  Hyperlipidemia. LDL is at goal at 73.  5.  The patient's other blood tests which were done here are ESR is slightly up at 31. ANCA panel is negative for c-ANCA and p-ANCA, and antiproteinase antibodies and the antimyeloperoxidase antibodies. Antiglomerular membrane antibodies were also negative. The patient will be going home. He also had HIV testing done, which showed negative for HIV 1 and 2, and the patient's HIV test is nonreactive, and he will be going home.   TIME SPENT ON DISCHARGE PREPARATION: More than 30 minutes.   ____________________________ Epifanio Lesches, MD sk:lg D: 05/08/2012 12:56:13 ET T: 05/08/2012 13:18:49 ET JOB#: [id number]  cc: Epifanio Lesches, MD, <Dictator> Epifanio Lesches MD ELECTRONICALLY SIGNED 05/22/2012 21:39

## 2014-06-13 NOTE — Consult Note (Signed)
Impression:    79yo male w/ h/o DM and pulmonary TB about 15 years ago who was admitted with hemoptysis.     He had pulmonary TB about 15 years ago.  He states that he was treated, but I could not get exactly how long he took medications for.  He did not go through the health department.  He has only had hemoptysis for 1 day, but he has had night sweats for about a week.  He has not lost any weight and his CT did not show obvious cavitary disease.    Agree with airborne isolation and sending sputums for AFB.  So far no sputums have been sent.  He is actively having hemoptysis.  Discussed with nursing.  Will get sputums.    A more common cause of hemoptysis would be bronchitis.  Will send a routine sputum as well.    Will start levofloxacin 731m po daily x 5 days.    Will send HIV test and ANCA, ANA, ESR and anti-GB AB.   Electronic Signatures: Blocker MPH, MHeinz Knuckles(MD) (Signed on 13-Mar-14 14:17)  Authored   Last Updated: 13-Mar-14 14:28 by Blocker MPH, MHeinz Knuckles(MD)

## 2014-06-14 NOTE — Consult Note (Signed)
General Aspect 79 y/o male w/o prior h/o CAD who presented to the ED today with a 2 wk h/o progressive n/v and was found to have an elevated troponin. *********************************   Present Illness 79 y/o male with a h/o HTN, HL, COPD, remote tob abuse, thoracic descending aortic aneurysm s/p repair ([facility] ~ 25 yrs ago), hemoptysis (2014), and GERD.  He says that he was also told that he has DM but doesn't take meds for it.  He has never been seen by cardiology.  He lives with his cousin here in [place].  He has some degree of chronic DOE and says he can usually walk a 1/4 mile prior to having to rest.  This hasn't changed recently.  He occasionally experiences fleeting sharp c/p.  This hasn't happened in a few wks.  About 2 wks ago, he began to experience dysphagia with intermittent nausea, and vomiting.  He has also had a productive cough with "different colored" sputum.  He denies experiencing fevers/chills but says that @ times he might develop "sweats."  Due to progression of n/v, he decided to present the the ED today.  He says that he was not having any c/p or dyspnea prior to his arrival today.  In the ED, ECG showed sinus tach with LVH, poor R prog,and inferolateral ST depression (similar to ECG in 04/2012).  CT of the chest, abd, pelvis was performed and was neg for PE or dissection but did show severe chronic lung dzs with presumably reactive mediastinal lymphadenopathy.  His troponin was found to be elevated @ 0.12, rising to 0.22.  We have been asked to eval.  He currently denies c/p.  Further, no h/o pnd, orthopnea, dizziness, syncope, or edema.   Physical Exam:  GEN pleasant, nad.   HEENT pink conjunctivae, moist oral mucosa   NECK supple  no bruits/jvd.   RESP normal resp effort  diminished breath sounds bilat L>R, scattered rhonchi.   CARD Regular rate and rhythm  Normal, S1, S2  No murmur   ABD denies tenderness  soft  normal BS   LYMPH negative neck   EXTR negative  cyanosis/clubbing, negative edema   SKIN normal to palpation   NEURO grossly intact, nonfocal.   PSYCH alert, A+O to time, place, person   Review of Systems:  General: occas "sweats"   Skin: No Complaints   ENT: Dysphagia   Eyes: No Complaints   Neck: No Complaints   Respiratory: Frequent cough  Short of breath  chronic doe.  cough x 2 wks.   Cardiovascular: Chest pain or discomfort  rare, sharp c/p.  chronic doe.   Gastrointestinal: Nausea  x 2 wks.   Genitourinary: No Complaints   Vascular: No Complaints   Musculoskeletal: No Complaints   Neurologic: No Complaints   Hematologic: No Complaints   Endocrine: No Complaints   Psychiatric: No Complaints   Review of Systems: All other systems were reviewed and found to be negative   Medications/Allergies Reviewed Medications/Allergies reviewed   Family & Social History:  Family and Social History:  Family History no h/o premature CAD.   Social History negative Illicit drugs, previously smoked 3-4 ppd x 20-30 yrs, quit ~ 10-15 yrs ago.  Prev drank heavily - none in 10-15 yrs.   Place of Living Home  lives in [place] with cousin.     Thoracic Desc Aortic Repair: a. [facility] 1990's.   Pancreatic Cancer:    Diabetes:    hemoptysis: 08-Apr-2010   copd:  HTN:   Home Medications: Medication Instructions Status  Protonix 40 mg oral delayed release tablet 1 tab(s) orally 2 times a day Active  Vitamin D2 50,000 intl units (1.25 mg) oral capsule 1 cap(s) orally once a month first monday of the month Active  Crestor 10 mg oral tablet 1 tab(s) orally once a day (at bedtime) Active  Advair Diskus 250 mcg-50 mcg inhalation powder 1 puff(s) inhaled every 12 hours. Active  lisinopril 5 mg oral tablet 1 tab(s) orally once a day Active  ProAir HFA CFC free 90 mcg/inh inhalation aerosol 2 puff(s) inhaled 4 times a day, As Needed- for Shortness of Breath  Active   Lab Results:  Hepatic:  27-May-15 07:56    Bilirubin, Total 0.5  Alkaline Phosphatase 56 (45-117 NOTE: New Reference Range 01/11/13)  SGPT (ALT)  11  SGOT (AST) 18  Total Protein, Serum  8.6  Albumin, Serum  2.7  Lab:  27-May-15 13:00   pH (ABG) 7.40  PCO2 41  PO2  65  FiO2 32  Base Excess 0.5  HCO3 25.4  O2 Saturation 94.7  O2 Device CANNULA  Specimen Site (ABG) RT RADIAL  Specimen Type (ABG) ARTERIAL  Patient Temp (ABG) 37.0 (Result(s) reported on 17 Jul 2013 at 01:07PM.)  Lactic Acid, Cardiopulmonary 0.6 (Result(s) reported on 17 Jul 2013 at 01:07PM.)  Routine Chem:  27-May-15 07:56   Result Comment TROPONIN - RESULTS VERIFIED BY REPEAT TESTING.  - RESULTS CALLED TO LAUREN SKELLY AT  - 1610 ON 07/17/13.Marland KitchenMarland KitchenCiales  - READ-BACK PROCESS PERFORMED.  Result(s) reported on 17 Jul 2013 at 08:41AM.  Lipase 99 (Result(s) reported on 17 Jul 2013 at 08:15AM.)  Glucose, Serum  127  BUN 10  Creatinine (comp) 1.29  Sodium, Serum 139  Potassium, Serum 3.9  Chloride, Serum 105  CO2, Serum 28  Calcium (Total), Serum 9.0  Osmolality (calc) 278  eGFR (African American) >60  eGFR (Non-African American)  53 (eGFR values <16m/min/1.73 m2 may be an indication of chronic kidney disease (CKD). Calculated eGFR is useful in patients with stable renal function. The eGFR calculation will not be reliable in acutely ill patients when serum creatinine is changing rapidly. It is not useful in  patients on dialysis. The eGFR calculation may not be applicable to patients at the low and high extremes of body sizes, pregnant women, and vegetarians.)  Anion Gap  6    12:53   Result Comment TROPONIN - RESULTS VERIFIED BY REPEAT TESTING.  - ELEVATED TROPONIN PREVIOUSLY CALLED ON  - 07-17-13 AT [id number]  Result(s) reported on 17 Jul 2013 at 01:41PM.  Cardiac:  27-May-15 07:56   Troponin I  0.12 (0.00-0.05 0.05 ng/mL or less: NEGATIVE  Repeat testing in 3-6 hrs  if clinically indicated. >0.05 ng/mL: POTENTIAL  MYOCARDIAL INJURY. Repeat   testing in 3-6 hrs if  clinically indicated. NOTE: An increase or decrease  of 30% or more on serial  testing suggests a  clinically important change)    12:53   Troponin I  0.22 (0.00-0.05 0.05 ng/mL or less: NEGATIVE  Repeat testing in 3-6 hrs  if clinically indicated. >0.05 ng/mL: POTENTIAL  MYOCARDIAL INJURY. Repeat  testing in 3-6 hrs if  clinically indicated. NOTE: An increase or decrease  of 30% or more on serial  testing suggests a  clinically important change)  Routine UA:  27-May-15 10:10   Color (UA) Yellow  Clarity (UA) Clear  Glucose (UA) Negative  Bilirubin (UA) Negative  Ketones (UA) Trace  Specific  Gravity (UA) >1.060  Blood (UA) 1+  pH (UA) 5.0  Protein (UA) Negative  Nitrite (UA) Negative  Leukocyte Esterase (UA) Negative (Result(s) reported on 17 Jul 2013 at 10:35AM.)  RBC (UA) 1 /HPF  WBC (UA) <1 /HPF  Bacteria (UA) NONE SEEN  Epithelial Cells (UA) 1 /HPF  Mucous (UA) PRESENT (Result(s) reported on 17 Jul 2013 at 10:35AM.)  Routine Coag:  27-May-15 14:36   Activated PTT (APTT)  37.9 (A HCT value >55% may artifactually increase the APTT. In one study, the increase was an average of 19%. Reference: "Effect on Routine and Special Coagulation Testing Values of Citrate Anticoagulant Adjustment in Patients with High HCT Values." American Journal of Clinical Pathology 2006;126:400-405.)  Routine Hem:  27-May-15 07:56   WBC (CBC) 6.9  RBC (CBC)  4.10  Hemoglobin (CBC)  11.7  Hematocrit (CBC)  36.4  Platelet Count (CBC) 324 (Result(s) reported on 17 Jul 2013 at 08:14AM.)  MCV 89  MCH 28.5  MCHC 32.0  RDW  15.7    14:36   WBC (CBC) 7.5  RBC (CBC)  3.88  Hemoglobin (CBC)  11.4  Hematocrit (CBC)  34.8  Platelet Count (CBC) 308  MCV 90  MCH 29.4  MCHC 32.8  RDW  15.5  Neutrophil % 56.4  Lymphocyte % 25.8  Monocyte % 13.6  Eosinophil % 3.7  Basophil % 0.5  Neutrophil # 4.2  Lymphocyte # 1.9  Monocyte # 1.0  Eosinophil # 0.3  Basophil #  0.0 (Result(s) reported on 17 Jul 2013 at 03:16PM.)   EKG:  EKG Interp. by me   Interpretation sinus tach, 119, LVH, inflat st dep and t changes, poor R progression - no acute changes.   Radiology Results: XRay:    27-May-15 08:26, Abdomen 3 Way Includes PA Chest  Abdomen 3 Way Includes PA Chest   REASON FOR EXAM:    Pain, constipation, hypoxia  COMMENTS:   May transport without cardiac monitor    PROCEDURE: DXR - DXR ABDOMEN 3-WAY (INCL PA CXR)  - Jul 17 2013  8:26AM     CLINICAL DATA:  Pain.  Hypoxia.    EXAM:  ABDOMEN SERIES    COMPARISON:CT, 07/02/2013.  Chest radiograph, 04/30/2013.    FINDINGS:  Normal bowel gas pattern. No evidence of obstruction, generalized  adynamic ileus or free air.  There is a right common iliac artery stent. There are vascular  calcifications in the pelvis and left upper quadrant. Soft tissues  are otherwise unremarkable.    Lungs show coarse interstitial thick, stable upper lobe scarring and  emphysema and bilateral dense nodular opacities. No acute findings.     IMPRESSION:  1. No acute findings.  No obstruction or free air.      Electronically Signed    By: Lajean Manes M.D.    On: 07/17/2013 08:28     Verified By: Lasandra Beech, M.D.,  CT:    27-May-15 09:33, CT Angiography Chest/Abd/Pelvis w/wo  CT Angiography Chest/Abd/Pelvis w/wo   REASON FOR EXAM:    chest pain, abdominal pain, hypoxia, h/o pancreatic   cancer  COMMENTS:       PROCEDURE: CT  - CT ANGIOGRAPHY CHEST/ABD/PELVIS  - Jul 17 2013  9:33AM     CLINICAL DATA:  Loss of appetite, abdominal pain. History of  pancreatic cancer.    EXAM:  CT ANGIOGRAPHY CHEST, ABDOMEN AND PELVIS    TECHNIQUE:  Multidetector CT imaging through the chest, abdomen and pelvis was  performed using  the standard protocol during bolus administration of  intravenous contrast. Multiplanar reconstructed images and MIPs were  obtained and reviewed to evaluate the vascular  anatomy.    CONTRAST:  125 cc Isovue 370 IV    COMPARISON:  CT abdomen and pelvis performed 07/02/2013. CT chest  05/01/2010 2014.    FINDINGS:  CTA CHEST FINDINGS    There is atherosclerotic change in the distal aortic arch which is  ectatic measuring 3.2 cm. There appears to be tube graft repair in  this area of the proximal descending thoracic aorta, stable since  prior study. No dissection.  No filling defects in the pulmonary arteries to suggest pulmonary  emboli. Heart is normal size. Moderate coronary artery  calcifications throughout the coronary arteries, best seen in the  left anterior descending and circumflex arteries.    Severe bullous emphysematous changesnoted within the lungs. Areas  of scarring and macro nodularity within the lungs bilaterally.  Numerous calcified pulmonary nodules and masses within the upper  lobes and left lower lobe. Findings are stable since prior study.  Borderline sized and mildly enlarged mediastinal and bilateral hilar  lymph nodes are again noted and stable. Conglomerate nodal area in  the superior right hilum measures up to 2.5 cm. Right paratracheal  adenopathy measures up to 1.5 cm. This is presumably reactive to the  extensive lung disease. No pleural effusions.  Reviewof the MIP images confirms the above findings.    CTA ABDOMEN AND PELVIS FINDINGS    Atherosclerotic irregularity noted throughout the abdominal aorta.  Irregular plaque noted in the infrarenal aorta with probable small  penetrating ulcer around the right posterior lateral wall of the  infrarenal aorta.    Mild impression and narrowing on the proximal celiac artery,  possibly related to median arcuate ligament. Noncalcified plaque  within the proximal superior mesenteric artery without significant  narrowing. Calcified plaque at the origin of the inferior mesenteric  artery likely narrows the ostium moderately. Single renal arteries  bilaterally. Mild to moderate  narrowing within the proximal left  renal artery. No visible significant stenosis in the right renal  artery. The left kidney demonstrates mild to moderate hydronephrosis  and is atrophic suggesting a longstanding process. The left ureter  is dilated into the mid pelvis where there is a caliber change noted  on image 259 of series 6. No stone.    Liver, gallbladder, right Kidney, spleen and adrenal glands are  unremarkable. Small low-density nodule in the posterior pancreatic  head measures 5 mm and is stable since priorstudy. No other  pancreatic mass noted. No pancreatic ductal or biliary ductal  dilatation.    Appendix is visualized and is normal. Stomach, large and small bowel  are unremarkable. No free fluid, free air or adenopathy.  Degenerative disc disease in the lower lumbar spine. No acute or  focal bony abnormality.    Review of the MIP images confirms the above findings.     IMPRESSION:  Apparent prior tube graft repair of the proximal descending thoracic  aorta with maximum diameter 3.2 cm. Atherosclerotic irregularity  within the descending thoracic aorta and abdominal aorta. Small  ulcerative plaque in the right posterior infrarenal aorta.    No evidence of dissection.    No evidence of pulmonary embolus.  Severely atrophic left kidney with chronic left hydronephrosis,  presumably related to distal ureteral stricture. No change.    Severe chronic lung disease changes without change since prior  study.    Mediastinal and bilateral  hilar adenopathy, presumably reactive,  stable.      Electronically Signed    By: Rolm Baptise M.D.    On: 07/17/2013 09:59       Verified By: Raelyn Number, M.D.,    Percocet: Unknown  Vital Signs/Nurse's Notes: **Vital Signs.:   27-May-15 16:36  Vital Signs Type Q 4hr  Temperature Temperature (F) 98.5  Celsius 36.9  Temperature Source oral  Pulse Pulse 102  Respirations Respirations 18  Systolic BP Systolic BP 063   Diastolic BP (mmHg) Diastolic BP (mmHg) 70  Mean BP 83  Pulse Ox % Pulse Ox % 94  Pulse Ox Activity Level  At rest  Oxygen Delivery 4L  *Intake and Output.:   Shift 27-May-15 23:00  Grand Totals Intake:   Output:  240    Net:  -240 24 Hr.:  -240  Urine ml     Out:  240  Length of Stay Totals Intake:   Output:  240    Net:  -240    Impression 1.  Nausea, dysphagia, and vomiting:  Pt presents with a 2 wk h/o progressive dysphagai, n/v.  Currently stable.  Further w/u per IM.  2. Elevated troponin:  Troponin mildly elevated in setting of above.  Etiology at this point unclear.  Though he does have a h/o chronic DOE, he says that this has not changed recently.  He also occasionally has sharp, fleeting c/p, but says it's been several wks since his last episode.  I pushed him on this b/c he is admitted with a diagnosis of c/p and he again denied any c/p today stating that he only came to the ED b/c of his n/v with food getting stuck in his throat/dysphagia.  He certainly has RF for CAD with a h/o HTN, HL, remote heavy tob abuse, and a reported h/o DM (nonfasting gluc only 127 today though).  Agree with plan to get echo.  If EF nl and troponin trend remains relatively flat, will plan on lexiscan myoview once #1 is stable.  If EF down or troponins bump significantly, will consider cath.  Cont asa, bb, statin, heparin.  3.  HTN:  stable.  4.  S/P Prox thoracic desc Ao repair: Pt says this was done ~ 25 yrs ago @ [facility].  Stable on CT today.  He does have an infrarenal ulcerative plaque.  No evidence of dissection.  Cont bb therapy.  5.  AECOPD:  productive cough over the past 2 wks with occasional diaphoresis.  WBC nl.  Afebrile.  Chest CT very abnl but stable compared to CT last year.  He is receiving steroids and abx per IM.   Electronic Signatures for Addendum Section:  Stace, Peace (MD) (Signed Addendum 27-May-15 18:10)  I have seen & examined the patient this PM along wtih Mr. Sharolyn Douglas,  NP-C.  I have reviewed the chart & lab data. I agree with the findings / exam & recommendations.   He is a very poor historian -- I too did not ilicit a symptom of CP or even worsening dyspnea.  All of his Sx sound GI in nature.  I am not sure why Troponin levels were checked, but unfortunately, they are mildly elevated.   He happens to have an abnormal EKG with LVH & early repolarization changes as well as multiple Cardiac CRFs as listed above. I took a quick look @ the initial Echo images (full study not yet completed & ready for read) -- EF does look  reduced with mild concentric LVH.  LCV function looks globally down with possible septal dyskinesis (will need full study to clarify).   Ultimately he will need an ischemic evaluation, but has had a history of Acute on Chronic Renal Failure in the past.  Unless the troponin levels rise dramatically, I think that in the absence of anginal symptoms, a non-invasive Myoview ST would not be unreasonable - favoring non-invasive over invasive.  He is not noting substatial CHF symptoms.  I do not think that a reduced EF with global dysfunction is a new finding for him.   Electronic Signatures: Rogelia Mire (NP)  (Signed 27-May-15 17:47)  Authored: General Aspect/Present Illness, History and Physical Exam, Review of System, Family & Social History, Past Medical History, Home Medications, Labs, EKG , Radiology, Allergies, Vital Signs/Nurse's Notes, Impression/Plan Leonie Man (MD)  (Signed 27-May-15 18:10)  Co-Signer: General Aspect/Present Illness, History and Physical Exam, Review of System, Family & Social History, Past Medical History, Home Medications, Labs, EKG , Radiology, Allergies, Vital Signs/Nurse's Notes, Impression/Plan   Last Updated: 27-May-15 18:10 by Leonie Man (MD)

## 2014-06-14 NOTE — H&P (Signed)
PATIENT NAME:  Colton Ruiz, Colton Ruiz MR#:  [id number] DATE OF BIRTH:  03/17/1935  DATE OF ADMISSION:  01/28/2014  REFERRING PHYSICIAN: Bobetta Lime A. Inocencio Homes, MD  PRIMARY CARE PHYSICIAN: Blenda Nicely, DO  ADMISSION DIAGNOSES: Pneumonia and non-ST segment elevation myocardial infarction.Marland Kitchen   HISTORY OF PRESENT ILLNESS: This is a 79 year old African American male who presents to the Emergency Department via EMS, complaining of shortness of breath. The patient admits that he has been coughing a lot for the last few days. He also admits to generalized weakness, but his voice and syntax is very difficult to understand. The patient also is missing multiple teeth, which makes his diction even harder to comprehend. He denies chest pain at this time, but admits that it hurts when he coughs, particularly on the right side. Chest x-ray in the Emergency Department revealed a right-sided pneumonia, and his laboratory evaluation showed a slight increase in his troponin, which prompted the Emergency Department to call for admission.   REVIEW OF SYSTEMS:  CONSTITUTIONAL: The patient denies fever, but admits to generalized weakness.  EYES: Denies inflammation or difficulty seeing.  EARS, NOSE, AND THROAT: The patient shakes his head no to tinnitus or sore throat.  RESPIRATORY: The patient admits to cough and shortness of breath.  CARDIOVASCULAR: Admits to chest pain that it has resolved at this time, and is only present with cough. It is difficult get him to commit and be clear about any other cardiovascular symptoms.  GASTROINTESTINAL: Denies nausea, vomiting, or abdominal pain.  GENITOURINARY: The patient denies dysuria. He does not appear to understand questions about frequency or hesitancy.  ENDOCRINE: The patient admits to being thirsty, but denies nocturia or polyuria.  HEMATOLOGIC AND LYMPHATIC: The patient admits to pain in his toes, which I have discovered from his previous documentation is secondary to some  peripheral vascular disease. I cannot understand whether he has any bleeding or easy bruising.  MUSCULOSKELETAL: The patient denies myalgias or arthralgias.  INTEGUMENT: Denies rashes or lesions.  NEUROLOGIC: Denies numbness in his extremities or dysarthria. (Notably, the patient denies that he has had a stroke).  PSYCHIATRIC: The patient denies depression or suicidal ideation.   PAST MEDICAL HISTORY: Coronary artery disease, diabetes type 2, hypertension, hyperlipidemia, peripheral vascular disease, COPD, a history of prostate cancer, and a history of tuberculosis. (These details are gathered from previous documentation.)   SURGICAL HISTORY: The patient has had endovascular aortic stent place via thoracotomy. He is also had percutaneous intervention, but it is unknown, which vessels he had the stents placed in.   SOCIAL HISTORY: The patient is a former smoker. He does not do drugs or drink alcohol.   FAMILY HISTORY: Unknown.   MEDICATIONS:  1.  Amoxicillin with clavulanic acid 875 mg/125 mg 1 tablet p.o. every 12 hours x 3 days.  2.  Aspirin 81 mg 1 tablet p.o. daily.  3.  Crestor 10 mg 1 tablet p.o. at bedtime. 4.  Docusate sodium 100 mg 1 capsule p.o. b.i.d.  5.  Fluticasone nasal spray 50 mcg inhaled, 2 sprays to each nostril b.i.d.  6.  Fluticasone with salmeterol 250 mcg/50 mcg inhaled powder 1 puff inhaled b.i.d.  7.  Lisinopril 5 mg 1 tablet p.o. daily.  8.  Metoprolol succinate extended release tablet 100 mg 1 tablet p.o. daily.   9.  Prednisone 10 mg, start with 60 mg and taper x 10 mg daily until complete.  10.  Protonix 40 mg extended release 1 tablet p.o. b.i.d.  11.  Ventolin high flow inhaler 90 mcg/inhalations 2 puffs inhaled 4 times a day as needed for shortness of breath.   ALLERGIES: PERCOCET.  PERTINENT LABORATORY RESULTS AND RADIOGRAPHIC FINDINGS: Glucose is 96, BUN 26, creatinine 1.56, serum sodium 140, potassium is 4.8, chloride is 102, bicarbonate is 25, calcium  is 8.7, serum albumin is 3.3, alkaline phosphatase is 56, AST is 35, ALT is 18. Initial troponin is 1.3. White blood cell count is 12.6, hemoglobin is 13.1, hematocrit is 41.4, platelet count is 184,000; MCV is 91. INR is 1.2.   Chest x-ray shows emphysematous changes. There is scarring in the lung apices and pleural thickening. There is bilateral parenchymal calcifications that likely represent granulomas, and there is a vague superimposed infiltrate in the right lung base that may represent a developing pneumonia.   PHYSICAL EXAMINATION:  VITAL SIGNS: Temperature is 99.4, pulse 100, respirations 19, blood pressure 127/65, pulse oximetry 93% on 2 L oxygen via nasal cannula.  GENERAL: The patient is alert and oriented x 3 in no apparent distress.  HEENT: Normocephalic, atraumatic. Pupils equal, round, and reactive to light and accommodation. Extraocular movements are intact. Mucous membranes are mildly tacky.  NECK: Trachea is midline. No adenopathy.  CHEST: Symmetric, atraumatic.  CARDIOVASCULAR: Regular rate and rhythm. Normal S1, S2. No rubs, clicks, or murmurs appreciated.  LUNGS: Clear to auscultation bilaterally except for slightly diminished breath sounds on the anterior chest over the lower lobe of the right lung.  ABDOMEN: Positive bowel sounds. Soft, nontender, nondistended. No hepatosplenomegaly.  GENITOURINARY: Deferred.  MUSCULOSKELETAL: The patient moves all 4 extremities equally. There is 5/5 strength in the upper and lower extremities bilaterally.  SKIN: No rashes, but the patient does have bandages over his great toes bilaterally that he states are taken care of by someone, presumably the wound clinic.  EXTREMITIES: No clubbing, cyanosis, or edema.  NEUROLOGIC: Cranial nerves II through XII are grossly intact.  PSYCHIATRIC: Mood is normal. Affect is congruent.   ASSESSMENT AND PLAN: This is a 79 year old male admitted for pneumonia and non-ST segment elevation myocardial  infarction.   1.  Pneumonia. This is community-acquired pneumonia as the patient lives at home and has not been hospitalized within the last 90 days. He was given Levaquin in the Emergency Department. We will supply supplemental oxygen as needed.  2.  Non-ST segment elevation myocardial infarction. Initially, the patient's mildly elevated troponin was thought to be secondary to demand ischemia, but a follow up troponin showed a dramatic increase to 11. His EKG appears the same as his previous EKGs. I have started a heparin drip and have consulted cardiology.  3.  Coronary artery disease. Continue aspirin and secondary prevention.  4.  Hypertension. Continue lisinopril and metoprolol.  5.  Hyperlipidemia. Continue Crestor.  6.  Peripheral vascular disease. The poorly healing ulcers on his great toes appear to be clean and dressed. No intervention is necessary at this time.  7.  Chronic obstructive pulmonary disease. Continue Advair and Spiriva, as well as albuterol p.r.n.  8.  Deep vein thrombosis prophylaxis. The patient is currently on treatment doses of heparin.  9.  Gastrointestinal prophylaxis. Unnecessary, as the patient is not critically ill.   CODE STATUS: The patient is a Full Code.   TIME SPENT ON ADMISSION ORDERS AND PATIENT CARE: Approximately 35 minutes.    ____________________________ Kelton PillarMichael S. Sheryle Hailiamond, MD msd:MT D: 01/28/2014 07:23:11 ET T: 01/28/2014 08:15:50 ET JOB#: [id number]  cc: Kelton PillarMichael S. Sheryle Hailiamond, MD, <Dictator> Kelton PillarMICHAEL S Sophiamarie Nease MD ELECTRONICALLY SIGNED  02/07/2014 0:05 

## 2014-06-14 NOTE — Discharge Summary (Signed)
PATIENT NAME:  Colton Ruiz, Colton Ruiz MR#:  [id number] DATE OF BIRTH:  1935/06/19  DATE OF ADMISSION:  01/28/2014 DATE OF DISCHARGE:  01/30/2014  ADMITTING DIAGNOSES: 1. Non-ST-segment elevation myocardial infarction.  2. Community-acquired pneumonia.  3. Hypertension.  4. Hyperlipidemia.  5. Coronary artery disease.  6. Peripheral vascular disease.   DISCHARGE DIAGNOSES:  1. Non-ST-segment elevation myocardial infarction.  2. Community-acquired pneumonia.  3. Essential hypertension.  4. Coronary artery disease.  5. Hyperlipidemia.  6. Peripheral vascular disease.  7. Chronic obstructive pulmonary disease.   CONSULTATIONS: Cardiology, Dr. Adrian Blackwater.  PROCEDURES: Cardiac cath was performed on 01/28/2014.   HISTORY AND PHYSICAL AND HOSPITAL COURSE: The patient is a 79 year old male who came in to the ED with a chief complaint of shortness of breath. Also, he was complaining of coughing for the past few days prior to this admission. Please review history and physical for details. Chest x-ray in the Emergency Department has revealed right-sided pneumonia. Troponin is slightly elevated. The patient is admitted to the hospital with a diagnosis of non-STEMI and community-acquired pneumonia. The patient is started on heparin drip at the time of admission.   HOSPITAL COURSE:  1. Non-ST-elevation myocardial infarction. The patient came in with shortness of breath. Initially, it was thought to be demand ischemia from pneumonia and shortness of breath, but the troponin trended up to 11. A heparin drip was continued a cardiology consult was placed with Dr. Adrian Blackwater. The patient had cardiac catheterization done on December 8th. Following that, they had recommended to continue medical management with aspirin, Plavix, Coreg, ACE inhibitor, lisinopril, and Crestor. The patient's pain is resolved with heparin drips. Subsequently, heparin drip was discontinued. Dr. Adrian Blackwater has recommended the patient to  follow up with him as an outpatient on December 16 at 2:30 p.m.  2. For community-acquired pneumonia, the patient was placed on antibiotics with levofloxacin, his condition significantly improved. The patient chronically lives on 2 liters of oxygen. As clinical situation is better,  the plan is to continue p.o. levofloxacin and 2 liters of chronic home oxygen.  3. Abnormal TSH. TSH is below normal and free T4 is elevated. A free T3  is ordered, which is pending as it was sent out. Primary care physician to follow up on that, and to do further investigation as-needed basis.  4. Hypertension: Continue his home medications, lisinopril, and metoprolol.  5. Hyperlipidemia. Continue Crestor.  6. Peripheral vascular disease. The patient has poorly healing ulcers, which are cleaned and dressed. Follow up with primary care physician.  7. Chronic obstructive pulmonary disease. Continue Advair and Spiriva.  8. Congestive heart failure.  Per cardiac catheterization, ejection fraction 30 %, no fluid overload at this time. Recommended to continue lisinopril, Coreg, and statin. The patient is also to continue aspirin and follow up with cardiology, Dr. Welton Flakes, as scheduled on December 16th.  Hospital course was fair and steady.   PHYSICAL EXAMINATION:  GENERAL APPEARANCE: Not in acute distress. No weight loss or weight gain.  HEENT: Normocephalic, atraumatic. Pupils are equally reacting to light and accommodation. No scleral icterus. No conjunctival injection. No sinus tenderness. Moist mucous membranes.  NECK: Supple. No JVD. No thyromegaly, trachea is midline.  LUNGS: Have basal crackles. No wheezing. No anterior chest wall tenderness on palpation. No accessory muscle usage.  CARDIOVASCULAR: S1, S2 normal. Regular rate and rhythm. No rubs or clicks.  GASTROINTESTINAL: Soft. Bowel sounds are positive in all 4 quadrants. Nontender, nondistended. No hepatosplenomegaly. No masses felt. NEUROLOGICAL: Awake, alert,  and  oriented x 3. Follows verbal commands. Motor and sensory are intact. Reflexes are 2+.  EXTREMITIES: No edema. No cyanosis. No clubbing.  SKIN: Warm to touch. Normal turgor. No rashes. No lesions.  PSYCHIATRIC: Flat mood and affect.  VITAL SIGNS: On December 10th, temperature 97.6, pulse 97 to 102, respirations 18, blood pressure 121/74, pulse ox 96% on 2 liters at rest.   LABORATORY DATA: The patient's BMP on December 10th, glucose 106, BUN and creatinine. sodium, potassium and chloride are normal. VLDL 10, LDL 40, anion gap greater than 60.Marland Kitchen. Serum osmolality and calcium are normal. Hemoglobin A1c 6.5, HDL 56, triglycerides 49, total cholesterol 106. TSH 0.295, free T4 is 1.63, T3 is pending. White count. WBC 13.8 on December 9th, 11.5 on December 10th.  Hemoglobin 12.4, hematocrit 39.0, platelet count 180.   IMAGING STUDIES: Echocardiogram: Left ventricular ejection fraction of 20% to 25%, elevated mean left atrial pressure, moderate dilated left atrium, mild tricuspid regurgitation. Cardiac catheterization is performed on December 8th: The cardiac catheter revealed a mid-LAD lesion up to 40% stenosis, in the circumflex 50% stenosis proximal RCA 100% stenosis, a large vascular territory with collaterals, proximal RI lesions 70% stenosis.    MEDICATIONS AT THE TIME OF DISCHARGE: Lisinopril 5 mg p.o. once daily, Protonix 40 mg p.o. b.i.d., Flonase 50 mcg 2 nostril sprays 2 times a day, aspirin 81 mg p.o. once daily, Ventolin 2 puffs inhalation 4 times a day as needed for shortness of breath, fluticasone/salmeterol 1 puff inhalation 2 times a day, Colace 100 mg p.o. once daily at bedtime. Crestor 20 mg p.o. at bedtime, Plavix 75 mg once daily, ciprofloxacin 250 mg p.o. b.i.d. for pneumonia, complete the course of antibiotics as prescribed and discontinue the medicine after. Coreg 6.25 mg 1 tablet p.o. b.i.d.   Discharge home with oxygen 2 liters via nasal cannula.   DIET: Low sodium, low fat, regular  consistency.   ACTIVITY: As tolerated. No exertional activity until seen and cleared by cardiology.  FOLLOWUP APPOINTMENTS: Follow up with primary care physician in a week. Follow up with cardiology, Dr. Welton FlakesKhan, on December 16th at 2:30 p.m., Outpatient follow up with cardiac rehab in 1-2 weeks. Primary care physician to follow up on free T3 results, which is pending and need to do further evaluation as needed basis.   Plan of care was discussed in detail with the patient. He verbalized understanding of the plan.   TOTAL TIME SPENT ON THE DISCHARGE: 40 minutes.  ____________________________ Ramonita LabAruna Londa Mackowski, MD ag:mw D: 02/05/2014 09:21:05 ET T: 02/05/2014 17:07:24 ET JOB#: [id number]  cc: Ramonita LabAruna Kendal Raffo, MD, <Dictator> Laurier NancyShaukat A. Khan, MD  Ramonita LabARUNA Heylee Tant MD ELECTRONICALLY SIGNED 02/06/2014 17:04

## 2014-06-14 NOTE — H&P (Signed)
PATIENT NAME:  Colton Ruiz, Colton Ruiz MR#:  [id number] DATE OF BIRTH:  20-Aug-1935  DATE OF ADMISSION:  07/17/2013  REFERRING PHYSICIAN:  Dr. Lowella Fairy  CHIEF COMPLAINT: Abdominal pain and chest pain.   PRIMARY CARE PHYSICIAN: [facility].   HISTORY OF PRESENT ILLNESS:  This is a very nice 79 year old gentleman who is a very poor historian with history of 2 weeks of abdominal and chest pain. The patient states that he comes today rather than 2 weeks ago just because he felt like there was enough time passed from the beginning of his symptoms. The patient states that he has incidents where food gets stuck on his chest and his chest starts hurting. He also has significant shortness of breath associated to activity, epigastric pain which got better after pain pills were given in the ER. He has cough that has been going on for over 2-3 weeks without any significant phlegm. His chest pain is worse with eating and it started just right after the abdominal pain started.  It is sharp.  It is 8/10 on occasions. He also has abdominal pain which is also 6-8 out of 10, but this is cramp-like. There is no radiation of the pain. Nothing makes it better. Food makes it worse occasionally, but sometimes could happen with resting. The patient has been constipated. He only has 1 bowel movement a week or every 2 weeks and this is something that has been going on for the past 2-3 months. He had some occasional episodes of sweating related to this abdominal and chest pain and, again, cough for 2 weeks. The patient is admitted for evaluation of this issue.   REVIEW OF SYSTEMS:  A 12 system review of systems is done.  CONSTITUTIONAL: No fever, fatigue or weight loss.  EYES: No blurry vision, double vision. EARS, NOSE, AND THROAT:   No tinnitus. Positive difficulty swallowing, feeling that food gets stuck behind his esophagus .  RESPIRATORY: Patient used to be a smoker. He has some COPD but no significant worsening of  respiratory problems at this moment.  CARDIOVASCULAR:  Positive chest pain, no orthopnea. No palpitations.  GASTROINTESTINAL: No vomiting. Positive occasional nausea. Positive for abdominal pain as described above. No diarrhea.   GENITOURINARY: No dysuria, hematuria, changes in frequency.  ENDOCRINOLOGY: No polyuria, polydipsia, or polyphagia.  MUSCULOSKELETAL: No significant leg pain or joint pain.  SKIN: No rashes or petechiae.  NEUROLOGIC: No numbness, weakness, dysarthria.  PSYCHIATRIC:  No anxiety or depression.   PAST MEDICAL HISTORY:  1. Type 2 diabetes.  2. Hypertension.  3. Hyperlipidemia.  4. Peripheral vascular disease.  5. COPD. 6. History of prostate cancer.  7. History of previous tuberculosis.  8. Coronary artery disease.   ALLERGIES: PERCOCET.   SURGICAL HISTORY: Stent placement at the level of the heart and stent placement at the level of the thoracic aorta. The patient states he does not have any more surgeries.   FAMILY HISTORY: The patient is a poor historian. He cannot tell me much information about this.   SOCIAL HISTORY: He used to smoke. He quit 10 years ago. He smoked for over 40 years,1-2 packs of cigarettes a day. He used to drink, but he quit about the same time. No use of drugs. He was in jail and he was released within 7 or 8 years ago. He is not working.  He is looking for disability.  CURRENT MEDICATIONS: Vitamin D 50,000 units once a month, Protonix 40 mg daily, ProAir HFA  2 puffs 4 times a day, lisinopril 5 mg once a day, Crestor 10 mg once a day, Advair Diskus 250 mcg/50 mcg every 12 hours.   PHYSICAL EXAMINATION:  VITAL SIGNS: Blood pressure 111/70, pulse 102, respirations 18, temperature 98.5, pulse oximetry 94 at 4 liters nasal cannula.   GENERAL: The patient is alert and he is oriented to person, place, and time, but he is very poor historian.  HEENT: Pupils are equal and reactive. Extraocular movements are intact. Mucosa is dry. Anicteric  sclerae. Pink conjunctivae. No oral lesions. No oropharyngeal exudates.  NECK: Supple. No JVD. No thyromegaly. No adenopathy. No carotid bruits.  CARDIOVASCULAR: Regular rate and rhythm. No murmurs, rubs, or gallops are appreciated. Slightly tachycardic in the hundreds. The patient states there is no tenderness to palpation of  the anterior chest wall.  LUNGS: Showing some rhonchi and rales, diffuse in both respiratory fields with significant wheezing.  He has use of accessory muscles but not dullness to percussion.  ABDOMEN: Soft, nontender, nondistended. No hepatosplenomegaly. No masses. Bowel sounds are positive.  EXTREMITIES: No edema, cyanosis or clubbing. Pulses +2. Capillary refill less than 3.  SKIN: No rashes, petechiae, positive dry skin.  LYMPHATIC: Negative for lymphadenopathy in neck or supraclavicular areas.   NEUROLOGIC: No focal findings. The patient has equal strength of all extremities. Cranial nerves II-XII intact.  PSYCHIATRIC: No significant agitation, but he is alert, oriented x 3.   LABORATORY DATA:  Glucose 127, creatinine 1.29, potassium 3.9. GFR is around 60%. Troponin is 0.12, initially; 0.22, the 2nd one.  AST less than 11, albumin 2.4, platelet count . Hemoglobin 11.7, platelet count 324, 000. ABG: pO2 of 65, pCO2 of 41, pH 7.4.  Lactic acid 0.6.   RADIOLOGICAL DATA: CT scan of the chest, abdomen, and pelvis shows prior tube graft repair of the proximal descending thoracic aorta, maximum diameter 2.2. There is a small ulcerative plaque at the right posterior infrarenal aorta. No dissection. Severely atrophic left kidney. No evidence of embolus. Bilateral hilar adenopathy, presumably reactive. The patient has had desaturation of oxygen down to 82% on room air.  There is large amount of stool in the colon.   ASSESSMENT AND PLAN: A 79 year old gentleman with history of multiple medical problems, including chronic obstructive pulmonary disease, smoking, diabetes on diet  control, coronary artery disease, comes with chest pain and elevation of troponin #1. 1. Chest pain. The patient has what appears to be an acute coronary syndrome. His chest pain is vague and mostly relieved at the moment.  He had an elevation of the first troponin slightly at 0.12, which doubled to 0.22 on the 2nd .  The patient is admitted under telemetry monitoring. Aspirin 325 mg daily, beta blocker is started with metoprolol. The patient on a heparin drip with statin therapy initiated. He also had lisinopril, p.r.n. nitroglycerin.  2. Acute respiratory failure. The patient has significant decrease of oxygen saturation. This is likely secondary to chronic obstructive pulmonary disease exacerbation, based on his symptoms of cough, his shortness of breath, and pulmonary exam. The patient is given IV steroids, nebulizers, pulmonary toilet, and started on antibiotics with Levaquin.  The elevation of troponin could be also secondary to demand ischemia due to the chronic obstructive pulmonary disease exacerbation.  3. Peripheral vascular disease. The patient had an ulcerated plaque, which apparently has been there.  His abdominal pain is not related to dead bowel as he has normal lactic acid and angiography and the CT scan did not show  any occluded arteries on the abdomina track . His abdominal pain could be secondary to severe constipation.  4. Other medical problems will continue to be evaluated.  5. DVT prophylaxis with heparin.  6. GI prophylaxis with Protonix.   I spent about 60 minutes with this patient.    ____________________________ Felipa Furnace, MD rsg:dd D: 07/17/2013 17:45:00 ET T: 07/17/2013 19:05:01 ET JOB#: [id number]  cc: Felipa Furnace, MD, <Dictator> Stephani Janak Juanda Chance MD ELECTRONICALLY SIGNED 07/27/2013 13:00

## 2014-06-14 NOTE — Consult Note (Signed)
Brief Consult Note: Diagnosis: chest pain.   Patient was seen by consultant.   Consult note dictated.   Comments: Appreciate consult for 79 y/o [place] with history of  T2DM, htn. PVD s/p aortic anyeursm repair, COPD, prostate cancre, TB, CAD, HL, for evaluation of dysphagia/odynophagia. Has had cardiac cath via radial artery today. States he is feeling very well, that he was having some diffculty swallowing/loss of appeite at home, but now is swallowing very well. Attributes this to God and his medical/nursing care. He denies abdominal pain, current issues swallowing, and all other GI complaints. Had barium swallow yesterday that demonstrated some mild reflux, and presbyesophagus/distal esophageal spasm. Had Speech eval today and several recommendations were made, including slow rate of eating, small bites, and chopped/mechanical soft diet with loose consistency. No history of EGD, did have colonoscopy last year by Dr Bluford Kaufmannh that was noted as normal. Of note, he did have ct of chest/abdomen/pelvis with COPD changes, and a small penetrating ulcer around the right posterior lateral wall of the infrarenal aorta without dissection, as well as some narrowing to the proximal celiac artery.  SMA ok. Moderate narrowing of IMA.  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 Recommend vascular consult.                                  Dysphagia/ odynophagia:  Likely related to presbyesophgus/esophgeal spasm. Agree with speech recommendations. Continue PPI. Will plan for EGD as clinically feasible after vascular consult.  Electronic Signatures: Vevelyn PatLondon, Duilio Heritage H (NP)  (Signed 29-May-15 18:12)  Authored: Brief Consult Note   Last Updated: 29-May-15 18:12 by Keturah BarreLondon, Chales Pelissier H (NP)

## 2014-06-14 NOTE — Consult Note (Signed)
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 repair of the proximal descending thoracic  aorta with maximum diameter 3.2 cm. Atherosclerotic irregularity  within the descending thoracic aorta and abdominal aorta. Small  ulcerative plaque in the right posterior infrarenal aorta.    No evidence of dissection.    No evidence of pulmonary embolus.  Severely atrophic left kidney with chronic left hydronephrosis,  presumably related to distal ureteral stricture. No change.    Severe chronic lung disease changes without change since prior  study.    Mediastinal and bilateral hilar adenopathy, presumably reactive,  stable.      Electronically Signed    By: Rolm Baptise M.D.    On: 07/17/2013 09:59       Verified By: Raelyn Number, M.D.,   ASSESSMENT AND PLAN:  Assessment/Admission Diagnosis 79 year old male admitted with chest and abdominal pain, also with dysphagia S/p heart cath Seen by GI, barium swallow showed small diverticulum in esophagus and possible presbyesophagus vs spasm We were asked to evaluate patient for CT findings of "small ulcerative plaque and mesenteric artery stenosis." He has some abdominal pain but not consistenly postprandial, possibly some weight loss. Denies food fear. Dr. Lucky Cowboy reviewed CT images- infrarenal aorta has small 23m  area of plaque, possible celiac stenosis, SMA is widely patent, right iliac stent present. No vascular intervention recommended at this time. This can be followed as an outpatient. Could consider mesenteric angiogram in the future if symptoms felt to be from mesenteric stenosis.   Electronic Signatures: HSu Grand(PA-C)  (Signed 30-May-15 11:21)  Authored: Chief Complaint and History, PAST MEDICAL/SURGICAL HISTORY, ALLERGIES, HOME MEDICATIONS, Family and Social History, Review  of Systems, Physical Exam, LABS, RADIOLOGY, Assessment and Plan   Last Updated: 30-May-15 11:21 by Su Grand (PA-C)

## 2014-06-14 NOTE — Consult Note (Signed)
PATIENT NAME:  Colton Ruiz, Colton Ruiz MR#:  [id number] DATE OF BIRTH:  Oct 20, 1935  DATE OF CONSULTATION:  07/19/2013  REFERRING PHYSICIAN:  Dr. Mordecai Maes.  CONSULTING PHYSICIAN:  Keturah Barre, NP  REASON FOR CONSULTATION: GI consult ordered by Dr. Mordecai Maes for evaluation of dysphagia, odynophagia, possible obstruction.   HISTORY OF PRESENT ILLNESS: I appreciate consult for this 79 year old African American man with history of type 2 diabetes, hypertension, PVD, status post aortic repair, COPD, prostate cancer, TB, CAD, hyperlipidemia for evaluation of dysphagia, odynophagia. Was admitted with chest pain a couple of days ago. Please refer back to admit note. Has had cardiac cath via radial artery today. States he is feeling very well, that he was having some difficulty swallowing and loss of appetite at home but now swallowing very well. Did have chest pain but not having any now. Attributes this to God and his medical nursing care. Currently denies abdominal pain. Current issue is swallowing and all other GI complaints. Did have barium swallow yesterday that demonstrated some mild reflux and presbyesophagus, distal esophageal spasm. There was no obstruction noted at that time. Did have speech evaluation today and several recommendations were made, including flow rate of eating, small bites and chopped/mechanical soft diet with loose consistency. No history of EGD. Did have colonoscopy last year by Dr. Bluford Kaufmann that was noted as normal. Of note, he also did have a CT of the chest, abdomen and pelvis. COPD changes. A small penetrating ulcer around the right posterior lateral wall of the infrarenal aorta without dissection, as well as some narrowing to the proximal celiac artery. SMA okay. Moderate narrowing of IMA.   REVIEW OF SYSTEMS: Systems reviewed. Agree with admit note; however, states he is not having any chest pain or difficulty swallowing at this time.   CARDIAC CATHETERIZATION: Did demonstrate LVEF at 30%  and some significant 2 vessel disease. Recommendations were to include aggressive medical therapy, and his dose of Toprol was increased.   LABORATORIES: Most recent labs: Glucose 156, BUN 13, creatinine 1.04, potassium 4.7. GFR greater than 60. Calcium 8.6, total protein 8.6, albumin 2.7, total bilirubin 0.5, ALP 56, AST 18, ALT11. Free thyroxine 1.53. WBC 6.6, hemoglobin 9.7, hematocrit 29.8, platelets 285, red cells normocytic with increased RDW.   PHYSICAL EXAMINATION:  VITAL SIGNS: Most recent: Temp 97.6, pulse 91, respiratory rate 18, blood pressure 131/61, SaO2 94% on room air.  GENERAL: Pleasant, alert, elderly man sitting in bed in no acute distress.  HEENT: Normocephalic, atraumatic. Sclerae are clear.  NECK: Supple. No thyromegaly or JVD.  CHEST: Respirations eupneic. Lungs clear. Slightly decreased at both bases.  CARDIAC: S1, S2. RRR. No MRG. No significant edema.  ABDOMEN: Flat, soft. Bowel sounds x 4. Nondistended, nontender. No peritoneal signs, guarding, rigidity, hepatosplenomegaly or other abnormalities.  SKIN: Warm, dry, pink. No erythema, lesion or rash.  NEUROLOGICAL: Alert, oriented x 3. Cranial nerves II through XII intact. Speech clear. No facial droop.  PSYCHIATRIC: Calm, pleasant. Limited insight.   IMPRESSION AND PLAN:  1. Peripheral vascular disease, ulcer to  aorta and stenosis of mesenteric arteries. Recommend vascular consult for evaluation.  2. Dysphagia, odynophagia, atypical chest pain: Likely related to presbyesophagus, esophageal spasm. Agree with speech recommendations. Continue proton pump inhibitor. Will plan for esophagogastroduodenoscopy as clinically feasible after vascular consult.   Thank you very much for this consult.   These services were provided by Vevelyn Pat, MSN, [facility], in collaboration with Barnetta Chapel, M.D., with whom I have discussed this patient in full.  ____________________________ Keturah Barrehristiane H. Gavina Dildine,  NP chl:gb D: 07/19/2013 18:23:52 ET T: 07/19/2013 19:36:32 ET JOB#: [id number]  cc: Keturah Barrehristiane H. Marella Vanderpol, NP, <Dictator> Eustaquio MaizeHRISTIANE H Pelham Hennick FNP ELECTRONICALLY SIGNED 07/31/2013 14:23

## 2014-06-14 NOTE — Consult Note (Signed)
Brief Consult Note: Diagnosis: dysphagia/odynophagia.   Comments: attempted to see patient: currently in specials for caridac cath.  Electronic Signatures: Vevelyn PatLondon, Vitalia Stough H (NP)  (Signed 29-May-15 13:51)  Authored: Brief Consult Note   Last Updated: 29-May-15 13:51 by Keturah BarreLondon, Byron Tipping H (NP)

## 2014-06-14 NOTE — Consult Note (Signed)
Chief Complaint:  Subjective/Chief Complaint Please see full GI consult and brief consult note.   Paitent seen and examined, chart reviewed.  Agree with plans as outlined with brief consult note.  I will round over the weekend.  Recommend continue diet per SLP recommendations.   Electronic Signatures: Barnetta ChapelSkulskie, Chandler Stofer (MD)  (Signed 29-May-15 19:30)  Authored: Chief Complaint   Last Updated: 29-May-15 19:30 by Barnetta ChapelSkulskie, Fruma Africa (MD)

## 2014-06-14 NOTE — Discharge Summary (Signed)
Dates of Admission and Diagnosis:  Date of Admission 06-Aug-2013   Date of Discharge 08-Aug-2013   Admitting Diagnosis Respi failure   Final Diagnosis Ac hypoxic respi failure HCAP- recent hospital admission COPD exacerbation Htn    Chief Complaint/History of Present Illness a 79 year old male with known history of coronary artery disease, chronic obstructive pulmonary disease, hypertension, hyperlipidemia, who presents with complaints of shortness of breath, fever, and cough, reports he has been having productive cough for the last four days, white-yellow in color. The patient was recently discharged two weeks ago, from [facility], where he had cardiac catheterization done here. The patient, as well, reports having some fever at home. The patient was hypoxic in ED at 82% on room air. He reports fever and chills at home. His T-max in the ED was 101. He was slightly hypotensive at one point, with systolic blood pressure in the 90s. Heart rate was 103 upon presentation. The patient had CT chest angio, which did not show any PE but did show evidence of right lower lobe lung pneumonia. The patient was started on Zosyn and levofloxacin for healthcare-acquired pneumonia, as he was recently hospitalized and he was on antibiotics as well. The patient's troponins were found to be borderline at 0.07. He denies any chest pain and, by reviewing his old troponins, it appears to be chronically elevated.   Allergies:  Percocet: Unknown  Hepatic:  15-Jun-15 18:22   Bilirubin, Total 0.5  Alkaline Phosphatase 47 (45-117 NOTE: New Reference Range 01/11/13)  SGPT (ALT) 14  SGOT (AST) 26  Total Protein, Serum 8.1  Albumin, Serum  2.4  Routine Micro:  15-Jun-15 18:22   Micro Text Report BLOOD CULTURE   COMMENT                   NO GROWTH AEROBICALLY/ANAEROBICALLY IN 5 DAYS   ANTIBIOTIC                       Culture Comment NO GROWTH AEROBICALLY/ANAEROBICALLY IN 5 DAYS  Result(s) reported on 10 Aug 2013 at 06:00PM.  Routine Chem:  15-Jun-15 18:22   Glucose, Serum  136  BUN 14  Creatinine (comp)  1.39  Sodium, Serum 139  Potassium, Serum 4.0  Chloride, Serum 107  CO2, Serum 26  Calcium (Total), Serum 8.5  Anion Gap  6  Osmolality (calc) 280  eGFR (African American)  57  eGFR (Non-African American)  49 (eGFR values <70m/min/1.73 m2 may be an indication of chronic kidney disease (CKD). Calculated eGFR is useful in patients with stable renal function. The eGFR calculation will not be reliable in acutely ill patients when serum creatinine is changing rapidly. It is not useful in  patients on dialysis. The eGFR calculation may not be applicable to patients at the low and high extremes of body sizes, pregnant women, and vegetarians.)  Result Comment TROPONIN - RESULTS VERIFIED BY REPEAT TESTING.  - CALLED TO BILL SMITH AT 1916:08/05/13  - READ-BACK PROCESS PERFORMED.  Result(s) reported on 05 Aug 2013 at 07:21PM.  17-Jun-15 04:47   Glucose, Serum  130  BUN 14  Creatinine (comp) 1.07  Sodium, Serum 140  Potassium, Serum 4.3  Chloride, Serum 107  CO2, Serum 26  Calcium (Total), Serum  8.2  Anion Gap 7  Osmolality (calc) 282  eGFR (African American) >60  eGFR (Non-African American) >60 (eGFR values <659mmin/1.73 m2 may be an indication of chronic kidney disease (CKD). Calculated eGFR is useful in patients with  stable renal function. The eGFR calculation will not be reliable in acutely ill patients when serum creatinine is changing rapidly. It is not useful in  patients on dialysis. The eGFR calculation may not be applicable to patients at the low and high extremes of body sizes, pregnant women, and vegetarians.)  Magnesium, Serum 2.0 (1.8-2.4 THERAPEUTIC RANGE: 4-7 mg/dL TOXIC: > 10 mg/dL  -----------------------)  Cardiac:  15-Jun-15 18:22   CPK-MB, Serum 0.6 (Result(s) reported on 05 Aug 2013 at 07:13PM.)  Troponin I  0.07 (0.00-0.05 0.05 ng/mL or less:  NEGATIVE  Repeat testing in 3-6 hrs  if clinically indicated. >0.05 ng/mL: POTENTIAL  MYOCARDIAL INJURY. Repeat  testing in 3-6 hrs if  clinically indicated. NOTE: An increase or decrease  of 30% or more on serial  testing suggests a  clinically important change)  CK, Total  36 (39-308 NOTE: NEW REFERENCE RANGE  03/25/2013)    20:37   Troponin I  0.07 (0.00-0.05 0.05 ng/mL or less: NEGATIVE  Repeat testing in 3-6 hrs  if clinically indicated. >0.05 ng/mL: POTENTIAL  MYOCARDIAL INJURY. Repeat  testing in 3-6 hrs if  clinically indicated. NOTE: An increase or decrease  of 30% or more on serial  testing suggests a  clinically important change)  16-Jun-15 01:48   Troponin I 0.05 (0.00-0.05 0.05 ng/mL or less: NEGATIVE  Repeat testing in 3-6 hrs  if clinically indicated. >0.05 ng/mL: POTENTIAL  MYOCARDIAL INJURY. Repeat  testing in 3-6 hrs if  clinically indicated. NOTE: An increase or decrease  of 30% or more on serial  testing suggests a  clinically important change)  Routine UA:  15-Jun-15 02:53   Color (UA) Amber  Clarity (UA) Turbid  Glucose (UA) Negative  Bilirubin (UA) Negative  Ketones (UA) Trace  Specific Gravity (UA) 1.021  Blood (UA) 1+  pH (UA) 5.0  Protein (UA) 100 mg/dL  Nitrite (UA) Negative  Leukocyte Esterase (UA) Negative (Result(s) reported on 05 Aug 2013 at 09:27PM.)  RBC (UA) 1 /HPF  WBC (UA) <1 /HPF  Bacteria (UA) NONE SEEN  Epithelial Cells (UA) NONE SEEN  Mucous (UA) PRESENT (Result(s) reported on 05 Aug 2013 at 09:27PM.)  Routine Hem:  15-Jun-15 18:22   WBC (CBC) 8.8  RBC (CBC)  4.07  Hemoglobin (CBC)  11.8  Hematocrit (CBC)  36.3  Platelet Count (CBC) 253 (Result(s) reported on 05 Aug 2013 at 07:09PM.)  MCV 89  MCH 29.0  MCHC 32.6  RDW  16.5  17-Jun-15 04:47   WBC (CBC) 8.2  RBC (CBC)  3.20  Hemoglobin (CBC)  9.5  Hematocrit (CBC)  28.7  Platelet Count (CBC) 206  MCV 90  MCH 29.6  MCHC 33.0  RDW  16.2  Neutrophil %  87.6  Lymphocyte % 8.3  Monocyte % 4.0  Eosinophil % 0.0  Basophil % 0.1  Neutrophil #  7.2  Lymphocyte #  0.7  Monocyte # 0.3  Eosinophil # 0.0  Basophil # 0.0 (Result(s) reported on 07 Aug 2013 at 06:14AM.)   PERTINENT RADIOLOGY STUDIES: XRay:    15-Jun-15 18:55, Chest PA and Lateral  Chest PA and Lateral   REASON FOR EXAM:    cough, fever  COMMENTS:       PROCEDURE: DXR - DXR CHEST PA (OR AP) AND LATERAL  - Aug 05 2013  6:55PM     CLINICAL DATA:  Weakness, emphysema, COPD.  Cough, fever.    EXAM:  CHEST  2 VIEW    COMPARISON:  04/30/2013  FINDINGS:  Severe COPD changes. Nodular scarring in the upper lobes  bilaterally, stable. Heart is normal size. No acute airspace  opacities. No effusions. No acute bony abnormality.     IMPRESSION:  Stable severe COPD and chronic lung changes.  No acute findings.      Electronically Signed    By: Rolm Baptise M.D.    On: 08/05/2013 19:03         Verified By: Raelyn Number, M.D.,  LabUnknown:  PACS Image     15-Jun-15 22:39, CT [facility] Chest with for PE  PACS Image    Pertinent Past History:  Pertinent Past History 1.  Type 2 diabetes. 2.  Hypertension.  3.  Hyperlipidemia.  4.  Peripheral vascular disease.  5.  Chronic obstructive pulmonary disease.  6.  History of prostate cancer.  7.  History of previous tuberculosis.  8.  Coronary artery disease.   Hospital Course:  Hospital Course *  Acute respiratory failure, hypoxic. The patient was saturating 82% on room air upon presentation.     pneumonia and COPD exacerbation. on oxygen. improving now. tapered oxygen. checked saturation on ambulation. stable on room air.  *  healthcare-acquired pneumonia, recently hospitalized        on Zosyn and levofloxacin.       repeat his CT scan in 3 months, after his treatment for pneumonia, to be sure of total resolution of the spiculated opacity.   will discharge on augmentin.  * Chronic obstructive pulmonary disease  exacerbation.  minimal wheezing.    on IV Solu-Medrol.  Advair. as-needed albuterol. swiched steroids oral.  *  Hypertension. Metoprolol, lisinopril.stable.  *  Hyperlipidemia. statin.   * History of peripheral vascular disease and coronary artery disease.    aspirin , ACE inhibitor and beta blockers and as-needed nitroglycerin.   Condition on Discharge Stable   Code Status:  Code Status Full Code   DISCHARGE INSTRUCTIONS HOME MEDS:  Medication Reconciliation: Patient's Home Medications at Discharge:     Medication Instructions  lisinopril 5 mg oral tablet  1 tab(s) orally once a day   protonix 40 mg oral delayed release tablet  1 tab(s) orally 2 times a day   crestor 10 mg oral tablet  1 tab(s) orally once a day (at bedtime)   metoprolol succinate 100 mg oral tablet, extended release  1 tab(s) orally once a day   fluticasone nasal 50 mcg/inh nasal spray  2  nasal 2 times a day   docusate sodium sodium 100 mg oral capsule  1 cap(s) orally 2 times a day   prednisone 10 mg oral tablet  Start at 60 mg and taper by 10 mg daily until complete   aspirin 81 mg oral tablet, chewable  1 tab(s) orally once a day   ventolin hfa cfc free 90 mcg/inh inhalation aerosol  2 puff(s) inhaled 4 times a day, As Needed - for Shortness of Breath   fluticasone-salmeterol 250 mcg-50 mcg inhalation powder  1 puff(s) inhaled 2 times a day   amoxicillin-clavulanate 875 mg-125 mg oral tablet  1 tab(s) orally every 12 hours x 3 days     PRESCRIPTIONS: PRINTED AND PLACED ON CHART   Physician's Instructions:  Diet Low Sodium   Diet Consistency Regular Consistency   Activity Limitations As tolerated   Return to Work Not Applicable   Time frame for Follow Up Appointment 1-2 weeks  PMD   Other Comments routine follow ups with PMD.   Electronic Signatures:  Vaughan Basta (MD)  (Signed 22-Jun-15 08:01)  Authored: ADMISSION DATE AND DIAGNOSIS, CHIEF COMPLAINT/HPI, Allergies, PERTINENT LABS,  PERTINENT RADIOLOGY STUDIES, PERTINENT PAST HISTORY, HOSPITAL COURSE, DISCHARGE INSTRUCTIONS HOME MEDS, PATIENT INSTRUCTIONS   Last Updated: 22-Jun-15 08:01 by Vaughan Basta (MD)

## 2014-06-14 NOTE — Consult Note (Signed)
PATIENT NAME:  Colton Ruiz, Colton Ruiz MR#:  [id number] DATE OF BIRTH:  08/26/1935  DATE OF CONSULTATION:  07/19/2013  REFERRING PHYSICIAN:  Adrian Saran, MD CONSULTING PHYSICIAN:  A. Wendall Mola, MD  REASON FOR CONSULTATION: Abnormal thyroid function tests.   HISTORY OF PRESENT ILLNESS: This is a 79 year old male seen in consultation at the request of Dr. Juliene Pina for abnormal thyroid function tests. The patient was admitted on May 27 for abdominal pain. During his hospitalization, he underwent CT abdomen with contrast on May 27 followed by cardiac catheterization earlier today. On May 28, he was found to have a low TSH of 0.035 uIU/mL and on May 29, he was found to have an elevated FT4 level of 1.53 ng/dL. He does have a history of thyroid disease. The patient was seen by me 1 time in the endocrine clinic in November 2012 for hyperthyroidism. This occurred after a hospitalization in November 2012, during which time he had also undergone a CT scan with IV contrast. On retesting of his thyroid function, approximately 2 weeks after his hospital discharge in November 2012, his thyroid function tests had normalized. In addition, a thyrotropin receptor antibody level was normal. He has never been treated for thyroid disease. He does reports occasional palpitations. He does report occasional tremors. He reports some weight loss, amount not known. States he can just tell when he loses weight. He attributes weight loss over the last several weeks to eating less due to poor appetite. He has not had any known recent exposure to glucocorticoids, amiodarone or lithium.   PAST MEDICAL HISTORY: 1.  Hypertension.  2.  COPD.  3.  Diabetes mellitus.  4.  Peripheral vascular disease.  5.  Hyperlipidemia.  6.  Coronary artery disease.  7.  Peripheral vascular disease.  8.  History of tuberculosis.   PAST SURGICAL HISTORY: 1.  Cardiac stents.  2.  Thoracic aortic aneurysm stent.   FAMILY HISTORY: No known thyroid disease.    ALLERGIES: PERCOCET.   SOCIAL HISTORY: The patient lives with family. He smokes 1 to 2 packs of cigarettes per day. Denies use of alcohol.   CURRENT MEDICATIONS: 1.  Vibramycin 1000 mg q.12 hours.  2.  Lisinopril 5 mg daily.  3.  Pantoprazole 40 mg daily.  4.  MiraLax 17 grams daily.  5.  NovoLog insulin sliding scale.  6.  Advair 250/50, 1 puff b.i.d.   REVIEW OF SYSTEMS:  GENERAL:  Reports weight loss, amount not known. No fevers.  HEENT:  Denies blurred vision. Denies sore throat.  NECK:  No neck discomfort. No dysphasia.  CARDIAC:  Reports palpitations. Denies chest pain at this time.  PULMONARY:  Reports occasional shortness of breath. Reports occasional cough.  ABDOMEN:  Has had abdominal pain; none at this time. Denies nausea.  EXTREMITIES: Denies leg swelling.  SKIN:  Denies rash or recent skin changes.  ENDOCRINE:  Reports occasional hot flashes and occasional chills. Denies intolerance to heat or cold.  GENITOURINARY:  Denies dysuria or hematuria.   PHYSICAL EXAMINATION: VITAL SIGNS:  Height 65.9 inches, weight 135 pounds, BMI 21.9. Temperature 97.6, pulse 91, respirations 18, blood pressure 131/61, pulse ox 94% on 1 liter at rest.  GENERAL:  African American male in no acute distress.  HEENT: EOMI.  No proptosis, lid lag or stare. Oropharynx clear. Mucous membranes moist.  NECK: Supple. Thyroid not enlarged. Thyroid is difficult to palpate. There is no discrete thyroid nodules. No thyroid bruit is present.  CARDIAC: Regular rate and rhythm.  No carotid bruits present.  PULMONARY: Clear to auscultation bilaterally.  ABDOMEN: Diffusely soft, nontender.  EXTREMITIES: No peripheral edema is present.  NEUROLOGIC: No tremor of the outstretched hands. No dysarthria. No acute deficits.  SKIN: No rash or dermatopathy is noted.  PSYCHIATRIC: Alert and oriented. Calm and cooperative.   LABORATORY DATA: Glucose 156, BUN 13, creatinine 1.04, potassium 4.7, chloride 108, bicarb  28, calcium 8.6, hematocrit 29.8, WBC 6.6, platelets 285.   ASSESSMENT: A 79 year old male with hyperthyroidism, recurrent. Given history of hyperthyroidism induced after exposure to iodinated contrast, as well as the lack of thyroid antibodies in the past, this suggests possible underlying multinodular goiter. It is unclear at this time whether he has a true toxic multinodular goiter requiring treatment versus transient hyperthyroidism.   RECOMMENDATIONS: 1.  Recommend no treatment for hyperthyroidism be given at this time.  2.  Recommend he have outpatient thyroid function tests repeated in 4 to 6 weeks. If he does remain hyperthyroid at that time, then could consider radioiodine ablation versus low dose thioamide.   3.  Would note his tendency to have hyperthyroidism after contrast dye load. He may benefit from low dose thioamide around that time of contrast dye load to prevent hyperthyroidism inducing a tachyarrhythmia. This could be considered if clinically a concern.   Thank you for the kind request for consultation.    ____________________________ A. Wendall MolaMelissa Solum, MD ams:dmm D: 07/19/2013 17:20:00 ET T: 07/19/2013 21:02:24 ET JOB#: [id number]  cc: A. Wendall MolaMelissa Solum, MD, <Dictator> Macy MisA. MELISSA SOLUM MD ELECTRONICALLY SIGNED 07/22/2013 14:12

## 2014-06-14 NOTE — Discharge Summary (Signed)
PATIENT NAME:  Colton Ruiz, NARDELLI MR#:  [id number] DATE OF BIRTH:  09-15-1935  DATE OF ADMISSION:  07/17/2013 DATE OF DISCHARGE:  07/20/2013  ADMISSION DIAGNOSES:  1. Chest pain with elevation in troponin.  2. Acute respiratory failure.  3. Peripheral vascular disease.   DISCHARGE DIAGNOSES:  1. Chest pain with elevation in troponin.  2. Acute respiratory failure.  3. Peripheral vascular disease. 4. Coronary artery disease. 5. Dysphagia. 6. Hyperthyroidism.  7. Anemia, chronic.   CONSULTATIONS:  1. Vascular consult.  2. Dr. Marva Panda. 3. Dr. Kirke Corin.   PROCEDURES: The patient underwent a cardiac catheterization 07/19/2013, which showed significant 2-vessel coronary artery disease, EF is 30%. Left main 20% stenosis at the ostium of the vessel segment. In a second lesion there was a 20% stenosis in the distal third of the vessel, mid LAD 30% tubular stenosis, first diagonal minor irregular luminal irregularities, circumflex normal size, proximal circumflex 90% stenosis, ramus intermedius diffuse 95% stenosis, proximal RCA supplied by moderate collaterals from the circumflex 100% stenosis.   RECOMMENDATIONS: Patient management should include aggressive medical therapy. Cardiomyopathy seems to be out of proportion to CAD. The RCA is chronically occluded with left-to-right collaterals. Optimization of medical therapy for CAD and CHF. A PCI to the ramus and left circumflex can be considered for anginal symptoms.    LABORATORIES AT DISCHARGE: Sodium 141, potassium 4.6, chloride 109, bicarbonate 30, BUN 20, creatinine 1.11, glucose is 100.   LDL of 63, cholesterol 108, HDL is 36.   A 2D echocardiogram showed an EF of 25%-30%, with diffuse hypokinesis that is severe, anterior, and anteroseptal wall hypokinesis.   HOSPITAL COURSE: A 79 year old male who presented with chest pain and an elevation in his troponins. For further details, please refer to the H and P.  1. Chest pain with elevation in  troponin thought to be secondary to supply/demand ischemia. However, given risk factors, the patient underwent a cardiac catheterization which did show 2-vessel significant disease. As per the recommendations per cardiology, aggressive medical management. His ejection fraction is 25% and 30%. He is on aspirin, metoprolol, statin, and ACE inhibitor. Can consider hydralazine and Imdur as an outpatient if blood pressure tolerates this. A PCI for ramus and left circumflex can be considered if further anginal symptoms appear. The patient had no chest pain during the hospitalization.  2. Acute respiratory failure secondary to chronic obstructive pulmonary disease exacerbation. He is much improved. He is not requiring oxygen and his lungs sound clear to auscultation. Steroids are being weaned and he was on Levaquin.  3. Peripheral vascular disease. The patient had an ulcerated plaque. We appreciate vascular surgery consultation regarding this. As per vascular surgeon, Dr. Wyn Quaker reviewed his CTs. Infrarenal aorta has small 1-mm area of plaque, possible celiac stenosis. SMA is widely patent. Right iliac stent present. No vascular intervention recommended at this time. This can be followed as an outpatient. Could consider mesenteric angiogram in the future if symptoms are felt from mesenteric stenosis. The patient for now will continue on aspirin.  4. Coronary artery disease as mentioned above. Aggressive medical management. 5. Dysphagia. The patient was complaining of some dysphagia. He had a barium swallow that showed diverticula possible presbyesophagus. GI was seeing the patient in consultation. At this time, no further recommendations for an EGD as the patient is tolerating his diet.  6. Speech also did see the patient.  7. Hypothyroidism. The patient's TSH was low with an elevated TSH. He has had this apparently back in 2012 as well,  as per Dr. Pricilla HandlerSolum's consultation. It seems to be that he has hyperthyroidism  associated with iodine contrast. As per her, he may benefit from low-dose thioamide around the time of contrast dye load to prevent hyperthyroidism inducing a tachyarrhythmia. This could be considered if clinically a concern. At this time she recommends repeating thyroid function tests in 4 to 6 weeks. If he remains for hyperthyroidism, he may need some sort of treatment.   DISCHARGE MEDICATIONS:  1. Advair Diskus 250/50 b.i.d.  2. Lisinopril 5 mg daily.  3. ProAir 2 puffs 4 times a day p.r.n.  4. Protonix 40 mg b.i.d.  5. Vitamin D2 at 50,000 international units on the first Monday of the month.  6. Crestor 10 mg daily.  7. Prednisone taper starting at 60 mg, taper by 10 mg every 2 days.  8. Doxycycline 100 mg b.i.d. for 3 days.  9. Nitroglycerin sublingual p.r.n. chest pain.  10. Aspirin 81 mg daily. 11. Metoprolol 100 mg daily.   DISCHARGE DIET: Low sodium.   DISCHARGE DIET: Mechanical soft, thin liquids, aspiration and reflux precautions, meds in puree as necessary for early esophageal clearing. Well-cut soft food for easy chewing/gum.  DISCHARGE FOLLOWUP: One to 2 weeks with Dr. Wyn Quakerew, Dr. Marva PandaSkulskie, and Dr. Kirke CorinArida, and 4-6 weeks with Dr. Tedd SiasSolum.   TIME SPENT: 40 minutes on this discharge.   The patient is medically stable for discharge.     ____________________________ Earlena Werst P. Juliene PinaMody, MD spm:lt D: 07/20/2013 11:58:42 ET T: 07/20/2013 21:35:48 ET JOB#: [id number]  cc: Zaniya Mcaulay P. Juliene PinaMody, MD, <Dictator> Muhammad A. Kirke CorinArida, MD Annice NeedyJason S. Dew, MD Christena DeemMartin U. Skulskie, MD Blenda NicelyAnita Mary Skariah, DO A. Wendall MolaMelissa Solum, MD  Patricia PesaSITAL P Liel Rudden MD ELECTRONICALLY SIGNED 07/21/2013 11:55

## 2014-06-14 NOTE — H&P (Signed)
PATIENT NAME:  Colton Ruiz, Colton Ruiz MR#:  [id number] DATE OF BIRTH:  [birth year]  DATE OF ADMISSION:  08/06/2013  REFERRING PHYSICIAN: Dr. Minna Antis  PRIMARY CARE PHYSICIAN: [facility].   CHIEF COMPLAINT: Shortness of breath, cough, fever.   HISTORY OF PRESENT ILLNESS: This is a 79 year old male with known history of coronary artery disease, chronic obstructive pulmonary disease, hypertension, hyperlipidemia, who presents with complaints of shortness of breath, fever, and cough, reports he has been having productive cough for the last four days, white-yellow in color. The patient was recently discharged two weeks ago, from [facility], where he had cardiac catheterization done here. The patient, as well, reports having some fever at home. The patient was hypoxic in ED at 82% on room air. He reports fever and chills at home. His T-max in the ED was 101. He was slightly hypotensive at one point, with systolic blood pressure in the 90s. Heart rate was 103 upon presentation. The patient had CT chest angio, which did not show any PE but did show evidence of right lower lobe lung pneumonia. The patient was started on Zosyn and levofloxacin for healthcare-acquired pneumonia, as he was recently hospitalized and he was on antibiotics as well. The patient's troponins were found to be borderline at 0.07. He denies any chest pain and, by reviewing his old troponins, it appears to be chronically elevated.   PAST MEDICAL HISTORY:  1.  Type 2 diabetes. 2.  Hypertension.  3.  Hyperlipidemia.  4.  Peripheral vascular disease.  5.  Chronic obstructive pulmonary disease.  6.  History of prostate cancer.  7.  History of previous tuberculosis.  8.  Coronary artery disease.    ALLERGIES: PERCOCET.   PAST SURGICAL HISTORY: 1.  Stent placement at the level of the heart.  2.  Stent placement at the level of the thoracic aorta.    FAMILY HISTORY: The patient is a poor historian and cannot give  any detailed family history.   SOCIAL HISTORY: The patient quit smoking 10 years ago. No alcohol. No illicit drug use.    HOME MEDICATIONS: 1.  Aspirin 81 mg daily.  2.  Lisinopril 5 mg oral daily.  3.  Sublingual nitroglycerin as needed.  4.  Crestor 10 mg oral at bedtime.  5.  Metoprolol succinate 100 mg oral daily.  6.  ProAir as needed.  7.  Protonix 40 mg 2 times a day.  8.  Vitamin D2, 50,000 international units monthly, first Monday of the month. 9.  Protonix 40 mg b.i.d.      REVIEW OF SYSTEMS:  CONSTITUTIONAL: He reports fever, chills, fatigue, weakness.  EYES: Denies blurry vision, double vision, inflammation.  ENT: Denies tinnitus, ear pain, hearing loss, epistaxis.  RESPIRATORY: Reports cough, shortness of breath, chronic obstructive pulmonary disease. Denies any hemoptysis.  CARDIOVASCULAR: Denies chest pain, edema, arrhythmia, palpitations, syncope.  GASTROINTESTINAL: Denies nausea, vomiting, diarrhea, abdominal pain.  GENITOURINARY: Denies dysuria, hematuria, renal colic.  ENDOCRINE: Denies polyuria, polydipsia, heat or cold intolerance.  HEMATOLOGIC: Denies anemia, easy bruising, bleeding diathesis.  INTEGUMENT: Denies acne, rash or skin lesion.  MUSCULOSKELETAL: Denies any swelling, gout, cramps.  NEUROLOGIC: Denies CVA, transient ischemic attack, tremors, vertigo, weakness.  PSYCHIATRIC: Denies anxiety, insomnia, or depression.   PHYSICAL EXAMINATION:  VITAL SIGNS: Temperature 97.9, T-max 101, pulse 100, respiratory rate 20, blood pressure 94/49, saturating 95% on 2 liters nasal cannula.  GENERAL: Frail elderly male, looks comfortable in bed, in no apparent distress.  HEENT: Head atraumatic,  normocephalic. Pupils equal, reactive to light. Pink conjunctivae. Anicteric sclerae. Dry oral mucosa.  NECK: Supple. No thyromegaly. No JVD.  CHEST: Good air entry bilaterally, with scattered wheezing and right lung rales.  CARDIOVASCULAR: S1, S2 heard. No rubs, murmurs or  gallops.  ABDOMEN: Soft, nontender, nondistended. Bowel sounds present.  EXTREMITIES: No edema. No clubbing. No cyanosis. Pedal and radial pulses well felt bilaterally.  PSYCHIATRIC: Appropriate affect. Awake, alert x3. Intact judgment and insight.  NEUROLOGIC: Cranial nerves grossly intact. Motor 5/5. No focal deficits.  MUSCULOSKELETAL: No joint effusion or erythema.  SKIN: Normal skin turgor. Warm and dry   PERTINENT LABORATORY DATA: Glucose 136, BUN 14, creatinine 1.39, sodium 139, potassium 4, chloride 107, CO2 26, ALT 14, AST 26, alkaline phosphatase 47. Troponin 0.07 x2. White blood cell 8.8, hemoglobin 11.8, hematocrit 36.3, platelets 253,000. Urinalysis negative for leukocyte esterase and nitrite. Imaging: CT chest angiogram with IV contrast showing no evidence of PE, and new focal spiculated air space opacity at the posterior aspect of the right lower lung lobe.  ASSESSMENT AND PLAN: 1.  Acute respiratory failure, hypoxic. The patient was saturating 82% on room air upon presentation. This is most likely related to his pneumonia and chronic obstructive pulmonary exacerbation. Will continue him on oxygen, with target oxygen saturation no more than 95%.  2.  Pneumonia. The patient will be treated for healthcare-acquired pneumonia, as he was recently hospitalized on Zosyn and levofloxacin. As well, will have patient repeat his CT scan in 3 months, after his treatment for pneumonia, to be sure of total resolution of the spiculated opacity. 3.  Chronic obstructive pulmonary disease exacerbation. The patient has minimal wheezing. He will be started on IV Solu-Medrol. Will be continued on his Advair. We will keep him on as-needed albuterol.  4.  Hypertension. Blood pressure is on the lower side. We will monitor. We will resume him back on his home medication.  5.  Hyperlipidemia. Continue with statin.  6.  History of peripheral vascular disease and coronary artery disease. Continue with aspirin and  continue with ACE inhibitor and beta blockers and as-needed nitroglycerin.  7.  Deep vein thrombosis prophylaxis. Subcutaneous heparin.   CODE STATUS: The patient reports he is a full code.   TOTAL TIME SPENT ON ADMISSION AND PATIENT CARE: 55 minutes.     ____________________________ Starleen Armsawood S. Elgergawy, MD dse:cg D: 08/06/2013 00:59:30 ET T: 08/06/2013 02:29:55 ET JOB#: [id number]  cc: Starleen Armsawood S. Elgergawy, MD, <Dictator> DAWOOD Teena IraniS ELGERGAWY MD ELECTRONICALLY SIGNED 08/06/2013 6:44

## 2014-06-15 NOTE — Consult Note (Signed)
Chief Complaint:   Subjective/Chief Complaint has had three negative sputum for AFB. Patient has some ongoing improvementg   VITAL SIGNS/ANCILLARY NOTES: **Vital Signs.:   20-Apr-13 14:27   Temperature Temperature (F) 98.1   Celsius 36.7   Temperature Source oral   Pulse Pulse 84   Respirations Respirations 18   Systolic BP Systolic BP 112   Diastolic BP (mmHg) Diastolic BP (mmHg) 71   Mean BP 84   Pulse Ox % Pulse Ox % 100   Pulse Ox Activity Level  At rest   Oxygen Delivery 2L  *Intake and Output.:   Shift 20-Apr-13 15:00   Grand Totals Intake:   Output:  550    Net:  -550 24 Hr.:  -550   Urine ml     Out:  550   Brief Assessment:   Cardiac Regular  -- LE edema  --Gallop    Respiratory normal resp effort  no use of accessory muscles  rhonchi    Gastrointestinal details normal Soft  Nontender  Bowel sounds normal   Assessment/Plan:  Assessment/Plan:   Assessment 1 Pulmonary nodules -afb negative -continue with abx -will f/u in office after discharge   Electronic Signatures: Yevonne PaxKhan, Nahom Carfagno A (MD)  (Signed 20-Apr-13 14:54)  Authored: Chief Complaint, VITAL SIGNS/ANCILLARY NOTES, Brief Assessment, Assessment/Plan   Last Updated: 20-Apr-13 14:54 by Yevonne PaxKhan, Rome Schlauch A (MD)

## 2014-06-15 NOTE — H&P (Signed)
PATIENT NAME:  Colton Ruiz, Colton Ruiz MR#:  [id number] DATE OF BIRTH:  June 08, 1935  DATE OF ADMISSION:  05/31/2011  PRIMARY CARE PHYSICIAN[place]    CHIEF COMPLAINT: Chest pain, nausea, vomiting.   HISTORY OF PRESENT ILLNESS: This is a 79 year old male who comes in from a skilled nursing facility due to atypical chest pain and also having some nausea and vomiting over the past few days. When the patient presented to the ER, he was noted to be in acute renal failure with a creatinine of over 10 and a BUN over 100. When he was here last month, his creatinine was normal. He also complains of atypical chest pain which is intermittent in nature associated with some episodes of nausea and vomiting. The patient has had 2 to 3 episodes of vomiting overnight which is consistent with the food that he ate. It was nonbloody and nonbilious in nature.   The patient denies any fever. Does admit to a cough which is chronic in nature. Does have productive sputum. No abdominal pain. No dysuria. No hematuria. No other associated symptoms presently. Given his renal failure, hospitalist services were contacted for further treatment and evaluation.   REVIEW OF SYSTEMS: CONSTITUTIONAL: No documented fever. No weight gain, no weight loss. EYES: No blurry or double vision. ENT: No tinnitus, no postnasal drip, no redness of the oropharynx. RESPIRATORY: No cough, no wheeze, no hemoptysis. CARDIOVASCULAR: Positive chest pain. No orthopnea, no palpitations, no syncope. GI: Positive nausea. Positive vomiting. No diarrhea, no abdominal pain, no melena, no hematochezia. GU: No dysuria, no hematuria. ENDOCRINE: No polyuria or nocturia. No heat or cold intolerance. HEME: No anemia, no bruising, no bleeding. INTEGUMENTARY: No rashes, no lesions. MUSCULOSKELETAL: No arthritis, no swelling, no gout. NEUROLOGIC: No numbness, no tingling, no ataxia, no seizure-type activity. PSYCH: No anxiety, no insomnia, no ADD.   PAST MEDICAL HISTORY:   1. Diabetes. 2. Hypertension. 3. Hyperlipidemia. 4. Chronic obstructive pulmonary disease. 5. History of prostate cancer. 6. History of a benign left renal mass.   ALLERGIES: Percocet causes a rash.   SOCIAL HISTORY: Used to be a smoker, quit about 7 to 8 years ago. Had about a 30 to 40-pack-year smoking history. Also used to drink, quit a few years ago. No illicit drug abuse. Lives at a skilled nursing facility.   FAMILY HISTORY: The patient cannot recall his family history presently.   CURRENT MEDICATIONS:  1. Tylenol 500 mg as needed.  2. Advair 250/50 1 puff b.i.d.  3. Aspirin 81 mg daily. 4. Chlorthalidone 25 mg daily.  5. Combivent 2 puffs q.i.d. as needed.  6. Crestor 20 mg daily.  7. Colace 100 mg daily as needed.  8. Loperamide 2 mg every eight hours as needed.  9. Metformin 500 mg daily.  10. Naprosyn 500 mg b.i.d. as needed.  11. Norvasc 10 mg daily.  12. Albuterol inhaler 2 puffs q.4 hours as needed.  13. Robitussin 10 mL as needed at bedtime.   PHYSICAL EXAMINATION ON ADMISSION.   VITAL SIGNS: Temperature 97.5, pulse 102, respirations 18, blood pressure 91/54, sats are 94% on 2 liters nasal cannula.   GENERAL: He is a pleasant appearing male in no apparent distress.   HEENT: Atraumatic, normocephalic. Extraocular muscles are intact. Pupils equal and reactive to light. Sclerae anicteric. No conjunctival injection. No pharyngeal erythema.   NECK: Supple. No jugular venous distention, no bruits, no lymphadenopathy, no thyromegaly.   HEART: Regular rate and rhythm, tachycardic. No murmurs, no rubs, no clicks.  LUNGS: He has some coarse crackles on the bases bilaterally, otherwise negative use of accessory muscles. No dullness to percussion.   ABDOMEN: Soft, flat. Hyperactive bowel sounds. No hepatosplenomegaly appreciated.   EXTREMITIES: No evidence of any cyanosis, clubbing, or peripheral edema. Has +2 pedal and radial pulses bilaterally.   NEUROLOGIC: The  patient is alert, awake, and oriented x3 with no focal motor or sensory deficits appreciated bilaterally.   SKIN: Moist and warm with no rash appreciated.   LYMPHATIC: There is no cervical or axillary lymphadenopathy.   LABORATORY, DIAGNOSTIC, AND RADIOLOGICAL DATA: Serum glucose 95, BUN 105, creatinine 10.5, sodium 139, potassium 4.6, chloride 104, bicarb 19. Liver function tests are within normal limits. Troponin less than 0.02. White cell count 13.4, hemoglobin 11, hematocrit 33.2, platelet count 224.   ASSESSMENT AND PLAN: This is a 79 year old male with a history of diabetes, hypertension, hyperlipidemia, chronic obstructive pulmonary disease, tuberculosis which has been treated, prostate cancer, and history of benign left-sided renal mass who presents to the hospital with chest pain, nausea, vomiting, and noted to be in acute renal failure.  1. Acute renal failure. The patient's creatinine on last admission about a month or so ago was 1, now is 10. It seems likely this is dehydration and prerenal in nature. I will aggressively hydrate him with IV fluids. Follow his BUN and creatinine and urine output. Renal dose his meds. Avoid any nephrotoxins. Get a renal ultrasound. Also, get a Nephrology consult. Will hold his metformin, chlorthalidone, and Naprosyn for now. 2. Chest pain. This is very atypical chest pain although the patient does have risk factors given his diabetes, hyperlipidemia, and history of tobacco abuse. I will observe him on telemetry. Check serial cardiac markers. Continue aspirin, nitroglycerin, and statin for now.  3. Diabetes. Blood sugars are stable. Hold his metformin. Place him on sliding scale insulin coverage given his renal failure for now.  4. Hyperlipidemia. Continue Crestor.  5. Chronic obstructive pulmonary disease. He does have a cough which is productive. Will follow his sputum cultures. He does not appear to be in COPD exacerbation. Will continue Advair, Combivent,  and p.r.n. nebulizer treatments.  6. Hypertension. The patient presently has relative hypotension. Hold his chlorthalidone and Norvasc for now.   CODE STATUS: The patient is a FULL CODE.   TIME SPENT: 50 minutes.  ____________________________ Rolly Pancake. Cherlynn Kaiser, MD vjs:drc D: 05/31/2011 12:40:14 ET T: 05/31/2011 13:19:25 ET JOB#: [id number]  cc: Rolly Pancake. Cherlynn Kaiser, MD, <Dictator> Houston Siren MD ELECTRONICALLY SIGNED 05/31/2011 14:33

## 2014-06-15 NOTE — Discharge Summary (Signed)
PATIENT NAME:  Colton Ruiz, Colton Ruiz MR#:  [id number] DATE OF BIRTH:  16-Apr-1935  DATE OF ADMISSION:  05/31/2011 DATE OF DISCHARGE:  06/15/2011  PRIMARY CARE PHYSICIAN: [facility].   REASON FOR ADMISSION: Chest pain, nausea, and vomiting.   DISCHARGE DIAGNOSES:  1. Acute renal failure on probable chronic kidney disease with acute component felt to be of prerenal etiology with admission creatinine of 10, discharge creatinine 1.4.  2. Positive UPEP with polyclonal para proteinuria status post bone marrow biopsy awaiting pathology result. The patient was noted to have positive free kappa and lambda chains.  3. Left upper lobe patchy infiltrate, new compared to chest x-ray 04/2011. Could be pneumonia versus malignancy versus possible MAI infection.  4. Acute chronic obstructive pulmonary disease exacerbation with hypoxia.  5. Prior history of pulmonary Mycobacterium tuberculosis.  6. Significant surgical scar involving the left lung.  7. Chest pain, resolved, felt to be of noncardiac etiology as if this was induced by cough likely related to possible pneumonia and chronic obstructive pulmonary disease versus possible malignancy with abnormal chest CT with negative cardiac enzymes.  8. History of type 2 diabetes mellitus, which is currently diet controlled.  9. Hyperlipidemia. 10. Vitamin D deficiency.  11. Anemia of chronic disease.  55. Former tobacco abuse, extensive.  13. History of benign left renal mass.  14. History of atrophic left kidney. 15. History of chronic hydronephrosis.  16. History of prostate cancer. 17. Multiple pulmonary nodular densities as well as left upper lobe patchy infiltrate which could be secondary to pneumonia versus MAI infection versus malignancy, left upper lobe nodular density, status post two weeks of antibiotics during this hospitalization. 18.  History of hypertension.  Blood pressure is currently well controlled off medications.    CONSULTATIONS:   1. Nephrology, Dr. Murlean Iba.  2. Oncology, Dr. Ma Hillock. 3. Pulmonary, Dr. Devona Konig.   DISCHARGE DISPOSITION: Home.   DISCHARGE MEDICATIONS:  1. Aspirin 81 mg daily.  2. Advair Diskus 250/50, one dose inhaled b.i.d.  3. Albuterol metered dose inhaler, 2 to 4 puffs inhaled every 4 to 6 hours p.r.n.  4. Mucinex 600 mg p.o. b.i.d. p.r.n. chest congestion/cough. 5. Tramadol 50 mg p.o. every 12 hours p.r.n. pain.  6. Vitamin D 50,000 units p.o. q. week. 7. Dulcolax EC 10 mg p.o. at bedtime p.r.n. constipation. 8. Tessalon Perles 100 mg p.o. q. 8 hours p.r.n. cough. 9. Tylenol 325 mg 1 to 2 tablets p.o. every 4 to 6 hours p.r.n. pain.  10. Crestor 10 mg p.o. at bedtime.  11. Protonix 40 mg p.o. b.i.d.    DISCHARGE CONDITION: Improved, stable.  DISCHARGE ACTIVITY: As tolerated.   DISCHARGE DIET: Low sodium, low fat, low cholesterol, ADA.   DISCHARGE INSTRUCTIONS:  1. Take medications as prescribed.  2. Return to the Emergency Department for recurrence of symptoms or for worsening shortness of breath, cough, fever, chills, weight loss, or coughing up blood.   FOLLOWUP INSTRUCTIONS:  1. Follow up with Dr. Devona Konig within 1 to 2 weeks. 2. Follow up with Dr. Ma Hillock within 1 to 2 weeks. 3. Follow up with Dr. Juleen China of nephrology on Monday 06/20/2011 at 9:00 a.m.   4. The patient needs repeat BMP within 1 week. 5. Follow up with primary care physician at [facility] within 1 to 2 weeks.  6. Follow up with Dr. Bernardo Heater within 2 to 3 weeks.   PROCEDURES: Please refer to previously dictated interim discharge summary by Dr. Tressia Miners  from 06/09/2011 for  list of pertinent procedures and pertinent laboratory data. Additional laboratory data as follows: Creatinine 1.4 on the day of discharge. Pathology from bone marrow biopsy is pending.   BRIEF HISTORY/HOSPITAL COURSE: The patient is a 79 year old male with past medical history of former/extensive tobacco abuse, chronic  obstructive pulmonary disease, history of pulmonary tuberculosis status post treatment, benign left renal mass with atrophic left kidney, chronic hydronephrosis, and prostate cancer who presented to the Emergency Department  with  complaints of chest pain, nausea, and vomiting. He was noted to be in acute renal failure and also was noted to have acute chronic obstructive pulmonary disease exacerbation. Please see dictated admission History and Physical for pertinent details surrounding the onset of this hospitalization. Please see below for further details: 1. Acute renal failure on probable chronic kidney disease:  The patient's creatinine last admission in March was normal. On this admission he presented with a creatinine of 10. Nephrology was following the patient while hospitalized.  Please refer to the dictated admission history and physical for pertinent details surrounding the onset of this hospitalization and also please refer to Dr. Wyatt Portela interim discharge summary from 06/09/2011 for details surrounding the majority of the patient's hospital course. This document serves to provide additional details. For acute renal failure, this was felt to be of prerenal etiology and with rehydration/IV fluids the patient's renal function has not significantly improved, but not sure where his baseline creatinine will settle and he may have an element of chronic kidney disease, especially given history of atrophic left kidney as well as underlying hypertension and diabetes mellitus. Nephrotoxins were held and we have discontinued his metformin, chlorthalidone, and naproxen. Renal ultrasound did not reveal any obstruction. The patient is now euvolemic at the time of discharge with significant improvement of his creatinine. He is nonoliguric. He will follow up with Dr. Juleen China of  [facility] closely in the office for close monitoring of his renal function and for ongoing management of suspected  CKD. He will also need to have a bone marrow biopsy results followed to see if there is any renal involvement given abnormal UPEP and the patient will follow up with Dr. Ma Hillock in this regard in the oncology clinic at [facility]. In regards to positive UPEP, the patient was noted to have polyclonal para proteinuria for which he underwent bone marrow biopsy and pathology results are currently pending at the time of discharge. The patient will follow up with oncologist Dr. Ma Hillock to obtain results. He had a normal SPEP. Bone scan revealed some lucencies.  Dr. Ma Hillock feels that myeloma is probably less likely as repeat UPEP was negative; however, we are awaiting results from bone marrow biopsy which was performed 06/07/2011. The patient will follow up with Dr. Ma Hillock in the office to obtain results and for further management. 2. Multiple pulmonary nodular densities including nodular density in the left upper lobe for which the patient was followed in-house by pulmonology: Differential diagnosis at this time was pneumonia, community-acquired versus atypical pneumonia with possible MAI infection versus underlying malignancy. Dr. Humphrey Rolls recommended having the patient complete a course of antibiotics for potential pneumonia. In the event of community-acquired pneumonia, the patient has been on IV Rocephin. He also received azithromycin, and in the event of possible MAI infection he has completed a week's course of clarithromycin as well. He has had approximately 14 days of IV Rocephin with significant improvement of his shortness of breath; he still has somewhat of a productive cough but is afebrile  at the time of discharge. Hopefully with antibiotic therapy, the abnormalities noted on chest CT will improve. Dr. Humphrey Rolls recommended repeating chest CT as an outpatient in approximately two weeks and recommended holding off on bronchoscopy at this time. Since antibiotics have now been completed, the patient will need  repeat chest CT  as an outpatient in two weeks for which he will follow up in the office with Dr. Humphrey Rolls.  If his chest CT remains abnormal with persistent nodular densities, then the patient will likely need to undergo further evaluation with possible bronchoscopic biopsy and washings to rule out other etiology such as malignancy.  Of note, the patient was initially was under airborne isolation and precautions given his history of pulmonary tuberculosis. Sputum samples were sent and patient has had three negative sputum AFB smears. QuantiFERON-TB gold was negative and thereafter the patient's airborne isolation was discontinued. Due to potential pneumonia he also experienced acute chronic obstructive pulmonary disease exacerbation with hypoxia for which he is on supplemental oxygen via nasal cannula and was on a course of IV steroids initially as well as bronchodilator support and was also started on Advair as per pulmonology recommendations. Overall with oxygen, IV steroids, IV antibiotics, and bronchodilator support, the patient's overall clinical condition has significantly improved with resolution of his chest pain and significant improvement of his shortness of breath. He has been successfully weaned off oxygen and has normal oxygen saturations on room air at the time of discharge at rest and also with ambulation.  3. Chronic obstructive pulmonary disease as well as abnormal chest CT, the patient will follow up with Dr. Humphrey Rolls in the office. He does have a prior history of pulmonary Mycobacterium tuberculosis approximately four years ago status post treatment and also has significant left lung surgical scar, but the patient not sure if he underwent any specific surgery but stated that fluid was taken out but he is not sure from where or when. He has a history of incarceration approximately six years ago and may have been exposed to tuberculosis at that time. As above,  sputum samples were analyzed and the patient had 3 negative  sputum AFB smears during this hospitalization with negative QuantiFERON-TB gold testing.  4. Chest pain: Felt to be of noncardiac etiology as this was induced by cough and likely related to possible pneumonia and chronic obstructive pulmonary disease versus underlying malignancy given abnormality noted on chest CT for which patient will need to follow up with pulmonology closely. Chronic obstructive pulmonary disease and pneumonia were managed as above and he was also given p.r.n. pain control. He has had 4 negative troponins and chest pain was felt to be noncardiac at this time.  No further cardiac diagnostics are recommended at this time unless his chest pain recurs. The patient is chest pain free at the time of discharge at rest and with ambulation. He was instructed to return to the ER if his chest pain recurs and may also need evaluation by cardiology at that time. In regards to an ischemic work-up he can undergo outpatient stress testing as he has been chest pain free for the majority of this hospitalization and when 4 negative troponins and no acute ST or T wave changes noted on EKG. In regard to possible stress testing, this can be arranged by his primary care physician.  5. Anemia of chronic disease: Suggested by iron panel and ferritin levels for which the patient has been hemodynamically stable and has asymptomatic anemia and there were no indications  for blood transfusions during this hospitalization. He will need to have his hemoglobin and hematocrit closely monitored as an outpatient by his primary care physician and will also follow up with the hematology/oncology clinic. He may require Procrit, which can be arranged as an outpatient and the patient will follow up with Dr. Ma Hillock in this regard. He will also need to have bone marrow biopsy results followed by Dr. Ma Hillock given abnormal UPEP, which may also be contributing to his anemia.  6. Type 2 diabetes mellitus: His metformin was held given  renal impairment and increased risk of lactic acidosis. His sugars have been relatively well controlled during this hospitalization on sliding scale insulin. There was time when his sugars were more elevated and this is while he was receiving steroids, but as his chronic obstructive pulmonary disease had improved his steroids were tapered and he has completed a course of steroids. His diabetes will be diet controlled for now and he will need to have his sugars closely monitored as an outpatient, for which he was advised to perform frequent Accu-Cheks at home and if sugars start to become uncontrolled he will need to notify his primary care physician to see if oral hypoglycemics can be reinitiated, and would consider agents such as low dose glipizide versus patient needing insulin.  If his renal function improves he may also be able to be restarted on metformin but would  defer this to his primary care physician.  7. Hyperlipidemia: The patient is to continue Crestor. 8. Vitamin D deficiency: The patient is to continue weekly ergocalciferol.  9. History of atrophic left kidney and benign left renal mass as well as chronic hydronephrosis: The patient will need to continue to follow up with Dr. Bernardo Heater.   On 06/15/2011 the patient is hemodynamically stable and without any chest pain, nausea, or vomiting, and was felt to be stable for discharge home with close outpatient followup to which the patient was agreeable.   TIME SPENT ON DICHARGE:  Greater than 30 minutes.  ____________________________ Romie Jumper, MD knl:bjt D: 06/19/2011 16:29:03 ET T: 06/20/2011 13:00:36 ET JOB#: [id number]  cc: Romie Jumper, MD, <Dictator> Mamie Levers, MD Allyne Gee, MD [facility] Romie Jumper MD ELECTRONICALLY SIGNED 06/27/2011 15:08

## 2014-06-15 NOTE — H&P (Signed)
PATIENT NAME:  Colton GrapesCRISP, Dominie L MR#:  [id number] DATE OF BIRTH:  17-Sep-1935  DATE OF ADMISSION:  05/03/2011  [facility]   CHIEF COMPLAINT: Coughing, shortness of breath, vomiting, left-sided rib pain, and abdominal pain.   HISTORY OF PRESENT ILLNESS: 79 year old male who is a poor historian. He has history of hypertension, hyperlipidemia, previous history of coronary artery bypass graft, history of prostate cancer, chronic obstructive pulmonary disease, history of pulmonary nodules in the past, anemia, and chronic left hydroureter. Today he presented with abdominal pain, nausea, and vomiting. He is complaining of left-sided pain in the left rib area. He is complaining of having a cough for two weeks, yellow expectoration, some fever at home, and also sweating. He is complaining of nausea and vomiting. He vomited 4 times today. He has also had diarrhea for two weeks. When he came in, he was extremely tachycardic with heart rate going up to the 130s at the time. He was hypoxic at 92% on room air. He had a CT of the abdomen and pelvis that showed the patient had bilateral infiltrates, interstitial densities in both lower lobes consistent with pneumonia.  He said he had some chest pain with the coughing. He is mainly complaining of left-sided pain which looks pleuritic in nature. The hospitalist was asked to admit the patient because of bilateral pneumonia. The patient was admitted previously in 03/2010 because of hemoptysis. He had a bronchoscopy done and he had AFB cultures done that were negative for tuberculosis at that time. Most likely this is not tuberculosis. Blood cultures have been drawn in the Emergency Room and he got a dose of Levaquin in the Emergency Room. He also got about 2 liters of normal saline bolus in the ER.  REVIEW OF SYSTEMS:  He is a poor historian. Positive for weakness, subjective fevers at home. No acute change in vision. No headache. No dizziness.  Complaining of cough, shortness of breath, and painful respirations. No hemoptysis. No palpitations. No syncope. Complaining of nausea, vomiting, diarrhea, and abdominal pain. No GI bleed.  No dysuria. No frequency. No thyroid problems. He has history of anemia. No rash. No joint pains or swelling. No focal numbness, anxiety, or depression.   PAST MEDICAL HISTORY:  1. Hypertension. 2. Hyperlipidemia.  3. History of coronary artery bypass graft. 4. History of tuberculosis, treated.  5. History of prostate cancer.  6. Chronic obstructive pulmonary disease.  7. Chronic bronchitis.  8. He was admitted in 03/2010 because of hemoptysis.  He had a bronchoscopy done and cultures negative for AFB. 9. History of pulmonary nodules. 10. Left renal mass. He also.  11. Chronic left hydroureter.  12. He was last admitted in 12/2010 because of diarrhea and abdominal pain.   PAST SURGICAL HISTORY: Coronary artery bypass graft.   ALLERGIES TO MEDICATIONS: Percocet.   HOME MEDICATIONS:  1. Acetaminophen 500 mg daily.  2. Advair Diskus 250/50 daily. 3. Aspirin 81 mg daily. 4. Chlorthalidone 25 mg daily. 5. Combivent 2 puffs 4 times a day. 6. Crestor 20 mg at bedtime. 7. Loperamide as needed.  8. Metformin 5 mg daily.   SOCIAL HISTORY: He lives with his son. He says he quit smoking about 10 years ago, alcohol about 5 to 6 years ago.   FAMILY HISTORY: Grandfather had cancer. Mother died at childbirth. He does not know much about his father.   PHYSICAL EXAMINATION:  VITAL SIGNS: Temperature 98.9, heart rate 130, respiratory rate 20, blood pressure 105/56, saturating 92%  on room air. His current vitals include a heart rate 112, respiratory rate 20, blood pressure 110/64, saturating 96 percent on 2 liters nasal cannula.  GENERAL:  This is an elderly African American male comfortably lying in bed, no acute distress, bilateral pupils are equal. Extraocular movements intact. No scleral icterus. No  conjunctivitis. Oral mucosa is moist. No pallor.   NECK: No thyroid tenderness, enlargement, or nodule. Neck is supple. No masses, nontender. No adenopathy. No JVD. No carotid bruit.   CHEST: He has some crackles at the bases more on the left side than on the right side. Normal respiratory effort. Not using accessory muscles of respiration.   HEART: Heart sounds are regular. No murmur. Good peripheral pulses. No lower extremity edema.   ABDOMEN: Appears to be soft and nontender. Normal bowel sounds. No hepatosplenomegaly. No bruit. No masses.   RECTAL: Deferred.   NEUROLOGIC: He is awake, alert, oriented to time, place, and person but poor historian.  Cranial nerves are intact. Moving all extremities against gravity.   EXTREMITIES: No cyanosis. No clubbing.   SKIN: Dry.   LABORATORY, DIAGNOSTIC, AND RADIOLOGICAL DATA: White count 10.5, hemoglobin of 13.5, platelet count 186,000. BMP: Sodium 135, potassium 4.3, BUN 21, creatinine 1.22.  Lipase 51. Troponin is negative. Urinalysis essentially nitrite negative, leukocyte esterase negative, WBC negative, clear. Chest x-ray showed that the patient had pneumonia in the right lower lobe. He had a CT of the abdomen and pelvis done because of abdominal pain that showed he has new abnormal confluent interstitial density in both lower lobes posteriorly consistent with pneumonia.  No acute hepatobiliary abnormality. No bowel abnormality.  Chronic atrophy of the left kidney and heterogenous enhancement, chronic hydroureter on the left. His EKG shows that he has sinus tachycardia at 126, normal axis. He has PVCs. He has a left bundle branch block. He has T-wave inversions in lateral leads, unchanged from prior EKGs.   IMPRESSION:  1. Bilateral pneumonia.  2. Nausea, vomiting, diarrhea with dehydration and tachycardia.  3. Chronic obstructive pulmonary disease, stable.  4. Hypertension.  5. Hyperlipidemia.  6. History of prostate cancer.  7. Chronic left  hydroureter and hydronephrosis.  PLAN:  79 year old male who has history of hypertension, hyperlipidemia, history of prostate cancer, and chronic obstructive pulmonary disease which is stable, presents today with cough for two weeks. He is complaining of nausea, vomiting, diarrhea, and what looks like a left pleuritic kind of pain. She was found to have confluent densities in bilateral lower lobes posteriorly consistent with pneumonia. His white count is normal. He was tachycardic and dehydrated when he came in. He got 2 liters of normal saline bolus.  I will continue giving him IV hydration, hold his chlorthalidone at this time. Blood cultures have been sent. We will send a sputum culture and give him IV Levaquin. Though his EKG has incomplete left bundle branch block, we will get serial cardiac enzymes also. The patient had a bronchoscopy done in 03/2010. He had cultures for AFB which were negative. These infiltrates are in the lower lobes, unlikely reactivation of tuberculosis.    TIME SPENT ON ADMISSION AND COORDINATION OF CARE:  55 minutes.  ____________________________ Fredia Sorrow, MD ag:bjt D: 05/03/2011 18:37:42 ET T: 05/04/2011 05:29:57 ET JOB#: [id number]  cc: Fredia Sorrow, MD, <Dictator> [facility] Fredia Sorrow MD ELECTRONICALLY SIGNED 05/25/2011 16:15

## 2014-06-15 NOTE — Consult Note (Signed)
PATIENT NAME:  Colton Ruiz, Colton Ruiz MR#:  [id number] DATE OF BIRTH:  1935-12-17  DATE OF CONSULTATION:  06/03/2011  REFERRING PHYSICIAN:  Dr. Serita Grit  CONSULTING PHYSICIAN:  Dianca Owensby R. Ma Hillock, MD  REASON FOR CONSULTATION: Anemia, renal failure, positive kappa and lambda light chains, evaluate for myeloma.   HISTORY OF PRESENT ILLNESS: Patient is a 80 year old African American gentleman who has been admitted to hospital on 04/09 with complaints of nausea, vomiting and chest pain. Patient reportedly has had recent hospitalization for pneumonia last month, creatinine, however, was in the normal range. Upon presentation to ER he was found to have acute renal failure with creatinine of 10, BUN over 100. Patient had overnight vomiting and poor oral intake. Nephrology has been following and feels that acute renal failure could be secondary to ATN and hypotension along with recent acute illness. With ongoing supportive treatment creatinine has improved and is down to 6.19 today, BUN is 59, calcium is 8.1. Other work-up, however, showed that serum protein electrophoresis was negative for M spike, hemoglobin was 9.8, ANA positive with mildly elevated RNP antibodies of 1.1, free kappa light chain elevated at 447.5 (normal range 3.3 to 19.4), free lambda light chain elevated at 199.75 (5.71 to 26.30), abnormal kappa/lambda ratio of 2.24 (0.26 to 1.65). Urine protein electrophoresis random showed 22.5% M spike along with two additional M spikes at a concentration of 6.1 and 8.5%, electrophoresis report pending. Iron study showed low TIBC of 155, low serum iron of 34 and iron saturation normal at 22%, ferritin elevated at 699.   Clinically, patient states that he continues to have generalized weakness. He has some arthritis which is chronic, denies any new bone pains. No fevers or night sweats. He has had some weight loss recently. Currently feels that he is getting better overall.   PAST MEDICAL HISTORY/PAST SURGICAL  HISTORY:  1. Hypertension.  2. Hyperlipidemia.  3. Chronic obstructive pulmonary disease. 4. Diabetes. 5. History of prostate cancer in the past.  6. History of benign left renal mass.   FAMILY HISTORY: Patient cannot recall.   SOCIAL HISTORY: Chronic smoker, quit 7 to 8 years ago. Also history of alcohol usage but quit a few years ago. Denies recreational drug usage. Lives at skilled nursing facility.   ALLERGIES: Percocet causes rash.   HOME MEDICATIONS:  1. Advair 250/50, 1 puff b.i.d. 2. Aspirin 81 mg daily.  3. Combivent 2 puffs q.i.d. p.r.n.  4. Chlorthalidone 25 mg daily.  5. Tylenol 500 mg p.r.n.  6. Crestor 20 mg daily.  7. Colace 100 mg daily p.r.n.  8. Loperamide 2 mg q.8 hours p.r.n.  9. Metformin 500 mg daily.  10. Norvasc 10 mg daily.  11. Naprosyn 500 mg b.i.d. p.r.n.  12. Albuterol inhaler 2 puffs q.4 hours p.r.n.  13. Robitussin 10 mL at bedtime p.r.n.   REVIEW OF SYSTEMS: CONSTITUTIONAL: As in history of present illness. Currently no fever or chills. No night sweats. HEENT: Currently denies headache, dizziness, epistaxis, ear or jaw pain. No new sinus symptoms. CARDIAC: Has intermittent chest pain but currently better. Denies any orthopnea or paroxysmal nocturnal dyspnea. LUNGS: Has chronic dyspnea, cough, and scanty sputum. No hemoptysis or pleuritic-type chest. GASTROINTESTINAL: Intermittent nausea. Recent vomiting. No diarrhea or blood in stools. GENITOURINARY: No dysuria, hematuria. SKIN: No new rashes or pruritus. HEMATOLOGIC: No obvious bleeding symptoms. EXTREMITIES: No new swelling or pain. MUSCULOSKELETAL: Has chronic arthritis which is unchanged. No new bone pains. NEUROLOGIC: No new focal weakness, seizures, or loss of consciousness.  ENDOCRINE: No polyuria or polydipsia. Appetite is poor.   PHYSICAL EXAMINATION:  GENERAL: Patient is elderly, weak and poorly nourished, resting in bed. Otherwise alert and oriented and converses appropriately. No icterus.  Pallor present.   VITAL SIGNS: Temperature 98.1, pulse 97, respiratory rate 20, blood pressure 121/75, 93% on room air.   HEENT: Normocephalic, atraumatic. Extraocular movements intact. Sclera anicteric. No oral thrush.   NECK: Supple without lymphadenopathy.   CARDIOVASCULAR: S1, S2, regular rate and rhythm.   LUNGS: Lungs show bilateral decreased breath sounds overall. Bibasilar crackles. No rhonchi.   ABDOMEN: Soft. No hepatosplenomegaly clinically.   EXTREMITIES: No edema or cyanosis.   SKIN: No rashes or major bruising.   NEUROLOGICAL: Cranial nerves are intact, moves all extremities spontaneously.   MUSCULOSKELETAL: No obvious joint deformity or swelling.   LABORATORY, DIAGNOSTIC, AND RADIOLOGICAL DATA: As in history of present illness. In addition bone survey shows few faint lucent areas in the proximal humerus bilaterally, questionable osteopenia versus other etiology, otherwise, no definite lytic or blastic lesions. BUN 59, creatinine 6.19, calcium 8.1. CBC from 04/10 shows hemoglobin 9.8, WBC 12,700, platelets 226, ANC 9500, ferritin 699, TIBC low at 155, serum iron low at 34, iron saturation 22%.   Ultrasound kidney bilaterally: Hyperechogenic suggestive of renal insufficiency, mild chronic hydronephrosis changes on the left, left kidney smaller than right. Recent LFT mostly unremarkable except albumin is low at 2.3   IMPRESSION AND RECOMMENDATIONS: 79 year old male who been admitted with acute renal failure with creatinine greater than 10, he does have multiple medical problems including history of diabetes, hypertension, chronic kidney disease along with nephrology opinion being that current acute renal failure could be likely from ATN, hypotension and recent illness/pneumonia. Patient, however, has been found to have anemia (iron indicis suggestive of anemia of chronic disease), monoclonal para proteinuria, abnormal kappa and lambda light chains and elevated kappa/lambda light  chain ratio. Serum protein electrophoresis is negative for M spike. Skeletal x-ray shows small lucent areas in both humeri. Given all these findings, it would be important to rule out possibility of underlying multiple myeloma. Will therefore plan to do bone marrow biopsy most likely on Tuesday, 04/16. Patient has been explained in detail about above findings and about bone marrow biopsy procedures, he is agreeable to this. Creatinine otherwise seems to be improving. Continue current supportive treatment. Patient does not have hypercalcemia or any severe bone pain issues. Will continue to follow. Patient explained above, agreeable to this plan.   Thank you for the referral. Please feel free to contact me if any additional questions.   ____________________________ Rhett Bannister Ma Hillock, MD srp:cms D: 06/04/2011 11:37:00 ET T: 06/04/2011 12:03:27 ET JOB#: [id number]  cc: Vicenta Olds R. Ma Hillock, MD, <Dictator> Alveta Heimlich MD ELECTRONICALLY SIGNED 06/06/2011 9:26

## 2014-06-15 NOTE — Discharge Summary (Signed)
PATIENT NAME:  Colton Ruiz, Colton Ruiz#:  [id number] DATE OF BIRTH:  Jan 21, 1936  DATE OF ADMISSION:  05/03/2011 DATE OF DISCHARGE:  05/06/2011  PRESENTING COMPLAINT: Shortness of breath, cough and vomiting.   DISCHARGE DIAGNOSES:  1. Systemic inflammatory response syndrome due to bilateral pneumonia.  2. Acute on chronic respiratory failure due to chronic obstructive pulmonary disease flare.   CONDITION ON DISCHARGE: Fair. Sats 92% on room air.   MEDICATIONS:  1. Crestor 20 mg daily at bedtime.  2. Combivent 1 puff inhalation twice a day.  3. Aspirin 81 mg daily.  4. Metformin 500 mg daily.  5. Docusate sodium 100 mg daily as needed for constipation.  6. Advair 250/50, 1 puff b.i.d.  7. Robitussin plus chest congestion medication 10 mL daily as needed.  8. Tylenol 500 mg daily as needed.  9. Levaquin 750 mg p.o. daily.  10. Prednisone taper.  11. Resume your chlorthalidone once your blood pressure is above 120 mmHg.   FOLLOW UP: Follow up with your [facility] primary care physician in 1 to 2 weeks.   CONSULTANTS: None.   PROCEDURES: None.   LABORATORY, DIAGNOSTIC AND RADIOLOGICAL DATA: Sputum culture light growth of gram-negative rods. Cardiac enzymes x3 negative. Blood cultures negative in 36 hours.   CT of the abdomen and pelvis with contrast shows new abnormal confluent interstitial density in both the lower lobes consistent with pneumonia. No acute hepatobiliary abnormality. No acute bowel abnormality. There is chronic atrophy of the left kidney with heterogenous enhancement. Chronic hydroureter on the left abruptly tapers in the true bony pelvis. Urinalysis negative for urinary tract infection. White count 10.5, hemoglobin and hematocrit 13.5 and 41.0. LFTs within normal limits. Protein 10.4, albumin 3.8, lipase 61.   BRIEF SUMMARY OF HOSPITAL COURSE: Ruiz. Metta ClinesCrisp is a 79 year old African American gentleman who comes in with:  1. Systemic inflammatory response syndrome secondary to  bilateral pneumonia. He was started on IV Levaquin and Rocephin which is changed to p.o. Levaquin, a total 8 to 10 days of antibiotics. CT of the abdomen and pelvis showed bilateral lower lobe infiltrate consistent with pneumonia. IV Solu-Medrol was changed to p.o. prednisone taper. Nebulizers, incentive spirometry and inhalers were used. Patient is afebrile. Cultures are negative and white count is stable.  2. Nausea, vomiting, diarrhea with dehydration and tachycardia, improved. Patient reports having chronic intermittent diarrhea. He appears euvolemic after IV hydration. Stool studies were not able to be sent since patient did not have diarrhea anymore.  3. Acute on chronic obstructive pulmonary disease with flare in the setting of pneumonia. Steroids were continued along with oxygen and inhalers. Patient is ex-tobacco abuse.  4. Hypertension with relative hypotension. Patient remained asymptomatic. He is asked to resume his chlorthalidone once his blood pressure is greater than 120.  5. Hyperlipidemia. On Crestor.  6. History of prostate cancer.  7. Chronic left hydroureter and hydronephrosis.  8. Patient clinically much improved ready for discharge. Patient will be discharged to home. He remained a FULL CODE.   TIME SPENT: 40 minutes.   ____________________________ Wylie HailSona A. Allena KatzPatel, MD sap:cms D: 05/06/2011 14:38:47 ET T: 05/06/2011 14:51:51 ET JOB#: [id number]  cc: Shayle Donahoo A. Allena KatzPatel, MD, <Dictator> [facility]  Willow OraSONA A Chevie Birkhead MD ELECTRONICALLY SIGNED 05/06/2011 15:32

## 2014-06-15 NOTE — Consult Note (Signed)
Patient seen, please see dictation for full details. Briefly, patient with h/o multiple medical problems including DM, HTN, CKD and others, admitted with acute renal failure with Cr > 10 felt to be likely from ATN, hypotension and recent illness/pneumonia. Cr slowly improving. Found to have anemia, monoclonal paraproteinuira and abnormal ligt chains, SPEP negative for M-spike. Skeletal XRay shows small lucent areas in humeri, ?osteopenia versus lytic bone lesions. Will schedule for bone marrow bx on Tuesday to r/o myeloma. Continue current supportive treatment. Patient explained above, agreeable to this plan.   Electronic Signatures: Izola PricePandit, Delise Simenson Raj (MD)  (Signed on 13-Apr-13 11:22)  Authored  Last Updated: 13-Apr-13 11:22 by Izola PricePandit, Lenisha Lacap Raj (MD)

## 2014-06-15 NOTE — Consult Note (Signed)
Chief Complaint:   Subjective/Chief Complaint about the same patient has one sputum being held for possible pathogen   VITAL SIGNS/ANCILLARY NOTES: **Vital Signs.:   17-Apr-13 14:24   Vital Signs Type Routine   Temperature Temperature (F) 98.5   Celsius 36.9   Temperature Source oral   Pulse Pulse 98   Pulse source per Dinamap   Respirations Respirations 20   Systolic BP Systolic BP 295   Diastolic BP (mmHg) Diastolic BP (mmHg) 70   Mean BP 89   BP Source Dinamap   Pulse Ox % Pulse Ox % 96   Pulse Ox Activity Level  At rest   Oxygen Delivery Room Air/ 21 %  *Intake and Output.:   Shift 17-Apr-13 15:00   Grand Totals Intake:   Output:  700    Net:  -700 24 Hr.:  -700   Urine ml     Out:  700   Brief Assessment:   Cardiac Regular  -- LE edema  --Gallop    Respiratory normal resp effort  no use of accessory muscles  rhonchi    Gastrointestinal details normal Soft  Nontender  Bowel sounds normal   Routine Chem:  17-Apr-13 06:02    Glucose, Serum 91   BUN 24   Creatinine (comp) 2.89   Sodium, Serum 142   Potassium, Serum 3.4   Chloride, Serum 105   CO2, Serum 25   Calcium (Total), Serum 7.9   Osmolality (calc) 287   eGFR (African American) 24   eGFR (Non-African American) 21   Anion Gap 12   Assessment/Plan:  Assessment/Plan:   Assessment 1 Pulmonary nodules -await AFB results -continue with abx -may consider bronch   Electronic Signatures: Allyne Gee (MD)  (Signed 17-Apr-13 15:54)  Authored: Chief Complaint, VITAL SIGNS/ANCILLARY NOTES, Brief Assessment, Lab Results, Assessment/Plan   Last Updated: 17-Apr-13 15:54 by Allyne Gee (MD)

## 2014-06-18 NOTE — Discharge Summary (Signed)
PATIENT NAME:  Colton Ruiz, Colton Ruiz MR#:  [id number] DATE OF BIRTH:  12-03-1935  ADMITTING PHYSICIAN: Katha HammingSnehalatha Konidena, MD  DISCHARGING PHYSICIAN: Enid Baasadhika Jakel Alphin, M.D.   PRIMARY CARE PHYSICIAN: Nonlocal.  CONSULTATIONS IN THE HOSPITAL: None.   DISCHARGE DIAGNOSES: 1.  Acute renal failure.  2.  Chronic kidney disease. 3.  Pneumonia. 4.  Constipation. 5.  Hemorrhoids.  6.  Hypertension.  7.  Coronary artery disease.  8.  Chronic obstructive pulmonary disease.  9.  Severe malnutrition.   DISCHARGE HOME MEDICATIONS:  1.  Lisinopril 5 mg p.o. daily.  2.  Protonix 40 mg p.o. b.i.d.  3.  Flonase nasal spray 2 sprays each nostril twice a day.  4.  Aspirin 81 mg p.o. daily. 5.  Ventolin inhaler 2 puffs 4 times a day as needed for shortness of breath.  6.  Advair 250/50 one puff b.i.d. 7.  Colace 100 mg p.o. at bedtime. 8.  Crestor 20 mg p.o. at bedtime.  9.  Plavix 75 mg p.o. daily.  10.  Coreg 6.25 mg p.o. b.i.d.  11.  Hydrocortisone rectal suppository per rectum 4 times a day for 7 days.  12.  Mucinex 600 mg twice a day for 7 days.  13.  Augmentin 875 mg 1 tablet p.o. b.i.d. for 2 more days.   DISCHARGE DIET: Low sodium diet.  HOME OXYGEN: 2 liters.   DISCHARGE ACTIVITY: As tolerated.   FOLLOWUP INSTRUCTIONS: 1.  Home health physical therapy and nursing.  2.  PCP followup in 1 week.  LABORATORY DATA AND IMAGING STUDIES: Prior to discharge WBC 15.0, hemoglobin 12.6, hematocrit 40.5, platelet count is 262,000. Sodium 141, potassium 4.4, chloride 106, bicarbonate 30, BUN 23, creatinine 1.2, glucose of 126, and calcium of 8.7. Urine cultures are negative. Chest x-ray from December 26 showing postinflammatory scarring, volume loss of upper lobes. No acute abnormalities noted. KUB showing retained stool. No bowel obstruction or bowel wall thickening noted.  The HIV test came back negative.   BRIEF HOSPITAL COURSE: [name] is a 79 year old malnourished African American male with  recent admission to the hospital for pneumonia, still on antibiotics, comes back with  worsening diarrhea, abdominal pain.  1.  Abdominal pain and worsening diarrhea. Had a CT abdomen on admission which did not show any acute abnormalities other than retained stool. Diarrhea could have been triggered from his recent oral antibiotics for pneumonia. He was empirically started on Cipro and Flagyl, was not able to provide a stool sample as he was very constipated. He was started on a bowel regimen, had 2 good bowel movements in the hospital. Complaining of hemorrhoidal pain. No bleeding noted. No obvious external hemorrhoids seen, so he was started on Anusol suppositories and outpatient surgical followup for hemorrhoidectomy.  2.  Pneumonia, unresolved. Still was on antibiotics.  Was treated with antibiotics here in the hospital. Being discharged on 2 more doses of Augmentin. Repeat chest x-ray showing atelectasis, scarring, but no acute infiltrate.  3.  Coronary artery disease. Continue his home medications.   His course has been otherwise uneventful in the hospital. He worked with physical therapy who recommended home health.   DISCHARGE CONDITION: Guarded with poor long-term program.   DISCHARGE DISPOSITION: Home with home health. He lives with his cousin.  TIME SPENT ON DISCHARGE: 45 minutes.   CODE STATUS: Full code.    ____________________________ Enid Baasadhika Atari Novick, MD rk:LT D: 02/17/2014 14:57:58 ET T: 02/17/2014 19:11:31 ET JOB#: [id number]  cc: Enid Baasadhika Takeo Harts, MD, <Dictator> Enid BaasADHIKA Lesleyann Fichter MD ELECTRONICALLY  SIGNED 03/10/2014 16:12

## 2014-06-18 NOTE — H&P (Signed)
PATIENT NAME:  Colton Ruiz, Colton Ruiz MR#:  Ruiz DATE OF BIRTH:  [birth year]  DATE OF ADMISSION:  02/13/2014  PRIMARY CARE PHYSICIAN: Blenda NicelyAnita Mary Skariah, DO  EMERGENCY ROOM PHYSICIAN: Gladstone Pihavid Schaevitz, MD   CHIEF COMPLAINT: Abdominal pain, nausea, diarrhea.   HISTORY OF PRESENT ILLNESS: A 79 year old male with coronary artery disease, peripheral vascular disease, hypertension, COPD, comes in because of nausea, vomiting and diarrhea. The patient had generalized abdominal pain, mainly in the lower quadrant, 8/10 in severity since yesterday, associated with multiple episodes of diarrhea. The patient had 5 loose stools today and vomited 1 time yesterday night. No cough. No fever. The patient's laboratory work showed acute renal failure. White count was 38.3 and the patient had abdominal CT because of this abdominal pain. The patient's abdominal CAT scan showed large volume stool in the rectum with constipation and atelectasis in basilar airspaces, compatible with pneumonia. The patient also has acute renal failure, creatinine was 2.08. It was better on December 16th. The patient's stool for Clostridium difficile is pending.  PAST MEDICAL HISTORY: Admitted on December 8th to December 10th for pneumonia and non-ST-elevation MI. The patient was given medical management for MI and was given antibiotics for pneumonia. The patient went home and came to the Emergency Room on 16th for shortness of breath and was given prednisone by the ER doctor, and the patient is on prednisone since December 16th. He denies any trouble breathing now and he already finished the prednisone on December 20th. The patient's other problems include as I mentioned, and the patient's past medical history: Hypertension, hyperlipidemia, coronary artery disease, PVD, history of pneumonia, non-ST elevation MI.   ALLERGIES: HE IS ALLERGIC TO PERCOCET.   SOCIAL HISTORY: Former smoker. No drinking. No drugs.   PAST SURGICAL HISTORY: History of  endovascular aortic stent placed via thoracotomy and history of renal artery stent.   MEDICATIONS:  1.  Aspirin 81 mg daily. 2.  Plavix 75 mg p.o. daily. 3.  Coreg 6.25 mg p.o. b.i.d. 4.  Crestor 20 mg p.o. daily. 5.  Colace 100 mg p.o. as needed for constipation daily.  6.  Fluticasone nasal spray 50 mcg 2 sprays daily.  7.  Advair Diskus 250/50 one puff b.i.d.  8.  Lisinopril 5 mg p.o. daily.  9.  The patient is on Protonix 40 mg p.o. b.i.d.  10.  Ventolin 90 mcg 4 times daily as needed for shortness of breath.   REVIEW OF SYSTEMS: CONSTITUTIONAL: The patient has some fatigue.  EYES: No blurred vision.  EARS, NOSE, AND THROAT: The patient has no tinnitus. No ear pain. No epistaxis. No difficulty swallowing.  RESPIRATIONS: The patient had cough, but now he is better.  CARDIOVASCULAR: No chest pain. No orthopnea. No pedal edema.  GASTROINTESTINAL: Has abdominal pain, nausea, vomiting, diarrhea since yesterday.  GENITOURINARY: No dysuria. The patient has no hematuria.  ENDOCRINE: No polyuria or nocturia. HEMATOLOGICAL: No anemia. The patient has history of MGUS.  MUSCULOSKELETAL: Complains of some joint pains on and off.  NEUROLOGIC: No numbness or weakness.  PSYCHIATRIC: No anxiety of insomnia.   PHYSICAL EXAMINATION:  VITAL SIGNS: Temperature 97.7. The patient's blood pressure is 120/70 and heart rate is around 110. Saturation is 97% on room air. GENERAL: Alert, awake, oriented. Cachectic African American male not in distress.  HEAD: Normocephalic, atraumatic.  EYES: Pupils equal, reacting to light. Extraocular movements are intact. No scleral icterus.  NOSE: No nasal lesions. No drainage.  EARS: No drainage. No external lesions.  MOUTH: No  lesions. No exudates.  NECK: Supple. No JVD. No carotid bruit. Thyroid in the midline. Neck is nontender. Normal range of motion.  RESPIRATORY: Good respiratory effort. Clear to auscultation. No wheeze noted.  CARDIOVASCULAR: S1, S2 regular,  tachycardic. The patient's PMI not displaced. No peripheral edema. The patient's pulses equal in carotid, femoral, arterial and pedal pulses.  GASTROINTESTINAL: Abdomen slight tenderness is present in the epigastric area and also lower quadrant on both sides. The patient does not have rebound tenderness. Bowel sounds are present. No organomegaly. No hernias.  MUSCULOSKELETAL: The patient is able to move all extremities x 4. Strength and tone are equal bilaterally.  SKIN: Turgor is decreased.  NEUROLOGICAL: Cranial nerves II-XII intact. Power 5/5 upper and lower extremities. Sensation intact. DTRs 2+ bilaterally.  PSYCHIATRIC: Motor and affect are within normal limits.   LABORATORY DATA: Lipase 34, troponin 0.05. Electrolytes: Sodium 136, potassium 5.4, chloride 100, bicarb 27, BUN 13 and creatinine 2.08, glucose 169. WBC 38.3, hemoglobin 13.9, hematocrit 44.3, platelets 362,000.  Chest x-ray shows by bi-apical and pleural parenchymal scarring secondary to prior granulomatous disease. Calcified pulmonary nodules consistent with granulomas. Chest is stable from prior examination.   CAT scan of the abdomen and pelvis without contrast showed large volume, left greater than the right, bibasilar airspace disease and right base consolidation, compatible with pneumonia or aspiration. Severe background emphysema, chronic left renal artery atrophy associated with left hydroureter without any obstructing urinary calculous. Large volume stool present in the right colon and the rectum compatible with constipation. Moderate bladder distention.   EKG: Sinus tachycardia with PACs, 110 beats per minute, anterior inversion in V4, V5 and V6.   ASSESSMENT AND PLAN:  1.  This is a 79 year old male patient with nausea, vomiting, diarrhea, abdominal pain, symptoms concerning for acute gastroenteritis. The patient used recent antibiotics, so we need to rule out Clostridium difficile colitis, so stool for Clostridium  difficile has been sent. Admit him to hospitalist service. Continue to check on stool for Clostridium difficile and start IV fluids with normal saline at 100 mL/h.  2.  Sepsis secondary to possible Clostridium difficile and also pneumonia. Continue IV fluids, follow blood cultures and also stool cultures and the patient will be on Flagyl empirically and also Levaquin to cover for pneumonia and also Flagyl to cover for the Clostridium difficile.  3.  Acute renal failure, likely secondary to gastroenteritis. The patient's lisinopril will be stopped. Continue fluids and monitor kidney function.  4.  History of non-ST-elevation myocardial infarction recently and the patient is on aspirin, Plavix, statins, ACE inhibitors and Coreg. We will continue Coreg. ACE inhibitors will be on hold due to renal failure. Continue aspirin, Plavix and Coreg and Crestor. 5.  Community-acquired pneumonia. The patient recently received antibiotics. At this time, lungs are clear. Continue oxygen and Levaquin at this time. 6.  Peripheral vascular disease. The patient's ulcers are healed. 7.  Chronic obstructive pulmonary disease. The patient is on Advair and we will continue them.  8.  The patient has chronic systolic heart failure. No pedal edema. Hold Lasix.  9.  The patient has acute renal failure. The patient's creatinine is up from 1.48 on December 16th to 2.08, so continue hydration and hold ACE inhibitors.  10.  Leukocytosis, probably due to sepsis and also due to recent steroid use. So follow cultures and also continue antibiotics. We will make further recommendations.   TIME SPENT: Fifty-five minutes.    ____________________________ Katha Hamming, MD sk:TT D: 02/13/2014 19:51:05 ET  T: 02/13/2014 22:01:29 ET JOB#: [id number]  cc: Katha Hamming, MD, <Dictator> Katha Hamming MD ELECTRONICALLY SIGNED 03/14/2014 20:59

## 2014-06-18 NOTE — Consult Note (Signed)
PATIENT NAME:  Colton Ruiz, Colton Ruiz MR#:  [id number] DATE OF BIRTH:  03/28/1935  DATE OF CONSULTATION:  01/28/2014  CONSULTING PHYSICIAN:  Laurier NancyShaukat A. Joy Reiger, MD   INDICATION FOR CONSULTATION: Non ST segment myocardial infarction.   HISTORY OF PRESENT ILLNESS: This is a 79 year old African American male, who presented to the Emergency Room by EMS with chest pain.  The chest pain was pressure-type associated with shortness of breath and diaphoresis.  He also noted that he has been coughing for the past few days. Chest x-ray revealed right-sided pneumonia.  His initial troponin was only mildly elevated but then it went up to 12, thus I was asked to evaluate the patient.   PAST MEDICAL HISTORY: History of coronary artery disease, diabetes type 2, hypertension, hyperlipidemia, peripheral vascular disease, chronic obstructive pulmonary disease, prostate cancer and history of tuberculosis  PAST SURGICAL HISTORY: Stent placement in the thoracic aorta.  He also had PCI and stenting of 1 of the coronaries.  It is unclear which vessel.   SOCIAL HISTORY: He is a former smoker. No history of EtOH abuse.   HOME MEDICATIONS: Crestor 10 mg once a day, aspirin 81 mg once a day, lisinopril 5 mg once a day, metoprolol succinate 100 mg once a day, Protonix 40 mg b.i.d., Ventolin inhaler.   ALLERGIES: PERCOCET.   PHYSICAL EXAMINATION:  GENERAL: He is alert and oriented x 3 in mild distress due to shortness of breath.  VITAL SIGNS: Temperature is 98.8, pulse is 99, respirations 20, blood pressure 124/71, saturation is 94%.  HEENT:  Revealed no JVD.  LUNGS: Good air entry.   HEART: Regular rate and rhythm. Normal S1, S2. No audible murmur.  ABDOMEN: Soft, nontender, positive bowel sounds.  EXTREMITIES: No pedal edema.  NEUROLOGIC: Appears to be intact.   LABORATORY DATA:  EKG shows sinus tachycardia, 110 beats per minute, left ventricular hypertrophy with T-wave inversion in V3 to V6, suggestive of (Dictation possibly  ischemia.   His chest x-ray shows chronic obstructive pulmonary disease type of changes with scarring in the apex and vague infiltrate in the right lung base.  Troponin initially was 1.3, BUN was 26, creatinine 1.56.  Follow-up troponin came back 11 and 12   ASSESSMENT AND PLAN: The patient has non ST segment myocardial infarction with accompanying chest pain, questionable right lower lobe pneumonia. The patient at his breakfast this morning.  We will do a cardiac catheterization this afternoon.   Thank you very much for the referral.     ____________________________ Laurier NancyShaukat A. Shakur Lembo, MD sak:DT D: 01/28/2014 10:57:20 ET T: 01/28/2014 11:24:18 ET JOB#: [id number]  cc: Laurier NancyShaukat A. Amos Gaber, MD, <Dictator> Laurier NancySHAUKAT A Sakiyah Shur MD ELECTRONICALLY SIGNED 03/04/2014 15:38

## 2014-06-22 NOTE — Discharge Summary (Signed)
PATIENT NAME:  Colton GrapesCRISP, Sherrod L MR#:  [id number] DATE OF BIRTH:  Jan 18, 1936  DATE OF ADMISSION:  03/18/2014 DATE OF DISCHARGE:  03/20/2014  ADMITTING DIAGNOSIS: Altered mental, shortness of breath.   DISCHARGE DIAGNOSES:  1. Altered mental.   2. Shortness of breath due to right-sided pneumonia, possible aspiration pneumonia.  3. Essential hypertension.  4. Hyperlipidemia.  5. Failure to thrive.  6. Chronic obstructive pulmonary disease with oxygen dependence.  7. History of coronary artery disease.  8. Recurrent pneumonia.  9. Gastroesophageal reflux disease.  10. Chronic pain.  11. Hyperlipidemia.   CONSULTANTS: Palliative care.    PERTINENT LABORATORY DATA AND EVALUATIONS: Admitting WBC count 26.2, hemoglobin 12.9, platelet count was 335,000, INR 1.1. Blood cultures x2: No growth. Urine culture showed 75,000 international units of methicillin-resistant Staphylococcus aureus.  CT scan of the head without contrast showed no acute abnormality, chronic microvascular disease noted.  Chest x-ray showed hyperinflation, stable bilateral upper lobe scarring, hazy airspace opacification in the right lower lobe.   HOSPITAL COURSE: Please refer to H and P done by the admitting physician. The patient is a 79 year old male who was sent to the hospital due to altered mental status and confusion. The patient has had a recent hospitalization with similar presentation. [facility]. Due to these symptoms, he was admitted to the hospital. He has had recurrent admissions and failure to thrive. Therefore, palliative care consult was obtained. Further discussions were held with the family and they stated that he would be continued to be followed by the [facility], but would have to get a hospital bed in the patient's house for possible comfort measures at home. The patient at this point is stable for discharge with the assumption of comfort care measures with the [facility] program.    MEDICATIONS: At the time of discharge:  Protonix 40 mg 1 tablet p.o. b.i.d., Colace 100 mg 1 tablet p.o. at bedtime, Crestor 20 daily, Ventolin 2 puffs 4 times a day as needed, Metamucil 1 packet daily as needed, Advair 250/50 one puff b.i.d., Flonase 2 sprays to each nostril daily, Aquaphor topically affected area b.i.d., 5 mg 1 tablet p.o. t.i.d. p.r.n., Maalox advanced 5 to 10 mL 4 times a day as needed, Tylenol 650 q. 8 p.r.n., Nitrostat 0.4 sublingual p.r.n., albuterol, ipratropium 3 mL inhalation 4 times a day as needed,  1 tablet p.o. b.i.d., albuterol 3 mL q. 4 p.r.n., every 2 hours as needed, Singulair 10 mg daily, Coreg 3.125 one tablet p.o. b.i.d., Plavix 75 p.o. daily, loperamide 2 mg daily as needed, Aspercreme 10% applied topically to affected area 3 to 4 times a day as needed, Lasix 20 daily, digoxin 125 mcg daily; prednisone taper starting at 60 mg, taper by 10 until complete, Levaquin 500 mg 1 tablet p.o. q. 24 x5 days, home oxygen 3 liters via nasal cannula continuous.   DIET: Low-sodium, low-fat, low-cholesterol, mechanical soft diet.   ACTIVITY: As tolerated.   FOLLOWUP: With [facility] in 1 to 2 days.    TIME SPENT: 35 minutes.    ____________________________ Lacie ScottsShreyang H. Allena KatzPatel, MD shp:by D: 03/21/2014 16:53:46 ET T: 03/21/2014 21:51:14 ET JOB#: [id number]  cc: Michaelyn Wall H. Allena KatzPatel, MD, <Dictator> Charise CarwinSHREYANG H Kaitlyne Friedhoff MD ELECTRONICALLY SIGNED 03/24/2014 8:57

## 2014-06-22 NOTE — H&P (Signed)
PATIENT NAME:  Colton Ruiz, Colton L MR#:  Ruiz DATE OF BIRTH:  12-Oct-1935  [facility].   CHIEF COMPLAINT: Altered mental status, shortness of breath.   HISTORY OF PRESENT ILLNESS: This is a 79 year old male who presents to the hospital sent in by his family due to altered mental status, confusion, and shortness of breath. Patient himself is fairly confused, therefore, most of the history is obtained from the chart and also from the family at bedside. As per the family, the patient has not been feeling well for the past few days. His oral intake has been poor. He has been also more confused and feeling more short of breath over the past few days. The patient went to [facility] and was referred to the ER for further evaluation. In the Emergency Room, the patient was noted to have a fever of 100.6, noted to be tachycardic, and also noted to have chest x-ray findings suggestive of pneumonia. Clinical diagnosis of sepsis secondary to pneumonia was made and hospitalist services were contacted for further treatment and evaluation.   REVIEW OF SYSTEMS:  CONSTITUTIONAL: Positive documented fever. Positive fatigue and weakness. No weight gain or weight loss.  EYES: No blurred or double vision.  EARS, NOSE, THROAT: No tinnitus. No postnasal drip. No redness of the oropharynx.  RESPIRATORY: Positive cough. No wheeze. No hemoptysis. Positive dyspnea.  CARDIOVASCULAR: No chest pain. No orthopnea, no palpitations, no syncope.  GASTROINTESTINAL: No nausea. No vomiting, no diarrhea. No abdominal pain. No melena or hematochezia.  GENITOURINARY: No dysuria or hematuria.  ENDOCRINE: No polyuria or nocturia. No heat or cold intolerance.  HEMATOLOGY: No anemia. No bruising, no bleeding.  INTEGUMENTARY: No rashes. No lesions.  MUSCULOSKELETAL: No arthritis, no swelling, no gout.  NEUROLOGIC: No numbness, tingling, or ataxia. No seizure activity.  PSYCHIATRIC:  No anxiety. No insomnia,  no ADD.   PAST MEDICAL HISTORY:  Consistent with COPD and home oxygen, hypertension, history of coronary artery disease, history of recent admission for pneumonia, hyperlipidemia, GERD, chronic pain.   ALLERGIES: PERCOCET WHICH CAUSES A RASH.  SOCIAL HISTORY:   The patient cannot recall his mother and father's social history.   CURRENT MEDICATIONS: Advair 250/50, 1 puff b.i.d., albuterol nebulizer 4 times daily as needed, albuterol-ipratropium nebulizers also 4 times daily as needed, Aquaphor topical to be applied b.i.d. to dry skin, Aspercreme 10% topical to be applied 3-4 times daily as needed, Cepacol lozenge every 2 hours as needed, Coreg 3.125 mg b.i.d., Crestor 20 mg a day, digoxin 125 mcg daily, Colace 100 mg at bedtime, Flonase 2 sprays to each nostril daily as needed, Lasix 20 mg daily, lisinopril 5 mg 1/2 tab daily, loperamide 2 mg daily as needed, Maalox 10 mL q. 4 hours as needed, Metamucil as needed, Mucinex 600 mg b.i.d.,  sublingual nitroglycerin every 5 minutes as needed, Plavix 75 mg daily, prednisone 20 mg t.i.d., Protonix 40 mg b.i.d., Singulair 10 mg daily, Tylenol 650 every 8 hours as needed, Ultram 50 mg t.i.d. as needed, albuterol inhaler 2 puffs 4 times daily as needed.   PHYSICAL EXAMINATION: Presently is as follows:  VITAL SIGNS: Noted to be temperature 98.8, pulse 119, respirations 20, blood pressure 115/52, saturations 94% on 2 liters nasal cannula.  GENERAL: He is a pleasant-appearing male, confused, but in no apparent distress.  HEAD, EYES, EARS, NOSE, AND THROAT: Atraumatic, normocephalic. Extraocular muscles are intact. Pupils are equal and reactive to light. Sclerae anicteric. No conjunctival injection. No pharyngeal erythema.  NECK:  Supple. There is no jugular venous distention. No bruits, no lymphadenopathy or thyromegaly.  HEART: Regular rate and rhythm, tachycardic. No murmurs, no rubs, no clicks.  LUNGS: Clear to auscultation bilaterally. No rales, no rhonchi, no  wheezes.  ABDOMEN: Soft, flat, nontender, nondistended. Has good bowel sounds. No hepatosplenomegaly appreciated.  EXTREMITIES: No evidence of any cyanosis, clubbing, or peripheral edema. Has +2 pedal and radial pulses bilaterally.  NEUROLOGIC: The patient is alert, awake, and oriented x 1. Globally weak, moves all extremities spontaneously. No other focal motor or sensory deficits appreciated bilaterally.  SKIN: Moist and warm, with no rashes.  LYMPHATIC: There is no cervical or axillary lymphadenopathy.   LABORATORY DATA: Shows white cell count 26, hemoglobin 12.9, hematocrit 40.5, platelet count of 335,000. The patient's comprehensive metabolic profile and troponins are still pending. Lactic acid is 2.5.   IMAGING:  The patient did have a CT of the head done without contrast which showed no acute intracranial process. The patient had a chest x-ray done which shows hyperinflation, stable bilateral upper lobe scarring, hazy airspace opacification right lower lobe, suspicious for superimposed infiltrate or pneumonia.   ASSESSMENT AND PLAN: This is a 79 year old male with history of chronic obstructive pulmonary disease, on home oxygen, hypertension, history of coronary artery disease, history of recent admission for pneumonia, hyperlipidemia, gastroesophageal reflux disease, chronic pain, who presents to the hospital due to altered mental status, shortness of breath, and noted to have sepsis secondary to pneumonia. Problem #1.  Sepsis.  The patient presented with tachycardia, fever, leukocytosis, and chest x-ray findings suggestive of pneumonia. This is likely clinical diagnosis. I will treat the patient with intravenous  fluids, give intravenous ceftriaxone and Zithromax for the pneumonia, follow hemodynamics, follow fever curve, and follow the patient clinically. Follow blood and sputum cultures. Problem #2.  Leukocytosis, likely secondary to the sepsis. Follow white cell count after intravenous  antibiotic therapy.  Problem #3.  Pneumonia, likely community-acquired pneumonia. Will give intravenous  ceftriaxone and Zithromax, follow sputum and blood cultures.  Problem #4.  Hypertension. Continue Coreg and lisinopril.  Problem #5.  History of coronary artery disease, no acute chest pain. Continue aspirin, Plavix, beta blocker, angiotensin-converting enzyme, and statin.  Problem #6.  Hyperlipidemia. Continue Crestor. Problem #7.  Gastroesophageal reflux disease. Continue Protonix. Problem #8.  Chronic pain. Continue tramadol. Problem #9.  Altered mental status.  I suspect this is metabolic encephalopathy from the sepsis and pneumonia. I will follow his mental status closely after his sepsis has been treated adequately. The patient's CT head is negative for any acute intracranial pathology.  Problem #10.  The patient is a DO NOT INTUBATE AND DO NOT RESUSCITATE. Will get a palliative care consult.   Time spent on admission was 50 minutes.    ____________________________ Rolly Pancake. Cherlynn Kaiser, MD vjs:LT D: 03/18/2014 17:16:51 ET T: 03/18/2014 18:15:50 ET JOB#: [id number]  cc: Rolly Pancake. Cherlynn Kaiser, MD, <Dictator> Houston Siren MD ELECTRONICALLY SIGNED 04/05/2014 12:48

## 2014-06-22 NOTE — Discharge Summary (Signed)
PATIENT NAME:  Colton Ruiz, Colton Ruiz MR#:  [id number] DATE OF BIRTH:  1935/12/22  DATE OF ADMISSION:  04/15/2014 DATE OF DISCHARGE:  04/18/2014  PRESENTING COMPLAINT: Shortness of breath.   DISCHARGE DIAGNOSES:  1.  Acute on chronic respiratory failure due to chronic obstructive pulmonary disease. 2.  Pneumonia.  3.  Chronic home oxygen use.   CODE STATUS: Full code.   DISCHARGE MEDICATIONS:  1.  Protonix 40 mg b.i.d. 2.  Docusate 100 mg p.o. at bedtime.  3.  Crestor 20 mg at bedtime.  4.  Ventolin HFA 2 puffs 2 times a day as needed.  5.  Metamucil 1 packet p.o. daily as needed.  6.  Advair 250/50 one puff b.i.d.  7.  Flonase 50 mcg 2 sprays nasally once a day as needed.  8.  Aquaphor topical ointment to affected area b.i.d.  9.  Ultram 50 mg p.o. t.i.d. as needed.  9.  Maalox 5-10 mL 4 times a day.  10.  Tylenol 325 mg 2 tablets every 8 hours as needed.  11.  Nitrostat 0.4 mg sublingual every 5 minutes as needed.  12.  DuoNebs 3 mL 4 times a day as needed.  13.  Mucinex D 600/60 one tablet b.i.d.  14.  Albuterol DuoNebs every 4 hours as needed.  15.  Cepacol lozenges 1 every 2 hours as needed.  16.  Singular 10 mg daily.  17.  Coreg 3.125 b.i.d.  18.  Plavix 75 mg daily.  19.  Loperamide 2 mg as needed.  20.  Aspercreme 10% topical lotion as before.  21.  Furosemide 20 mg p.o. daily.  22.  Digoxin 125 mcg p.o. daily.  23.  Bisacodyl 5 mg delayed-release every other day.  24.  Naproxen 250 mg 1 tablet b.i.d. as needed.  25.  Levaquin 750 mg every 48 hours.   DIET: Regular, mechanical soft.   FOLLOWUP: With [facility] per your appointment.   BRIEF SUMMARY OF HOSPITAL COURSE: [name] is a 79 year old African American gentleman with chronic respiratory failure on oxygen, comes in from home with: 1.  Clinical sepsis likely due to early right-sided pneumonia: He was started on Levaquin. Received IV fluids, steroids were tapered. He is back to his baseline. Will be discharged  to home. Follow with the [facility] as outpatient.  2.  Hypertension, stable: Continue Coreg.  3.  Hyperlipidemia on Crestor.  4.  History of CAD, stable.   Palliative care saw the patient and discussion was made with family, by Dr. Hinda LenisSharon Reilly from [facility]. The patient wants to be a full code. Hospital stay otherwise remained stable.   CODE STATUS: He is a full code.   TIME SPENT: 40 minutes.    ____________________________ Wylie HailSona A. Allena KatzPatel, MD sap:bm D: 04/18/2014 15:14:24 ET T: 04/19/2014 01:34:30 ET JOB#: [id number]  cc: Kjersti Dittmer A. Allena KatzPatel, MD, <Dictator> Willow OraSONA A Kelie Gainey MD ELECTRONICALLY SIGNED 04/22/2014 17:37

## 2014-06-22 NOTE — Consult Note (Signed)
   Comments   Spoke with Dr. Victory Dakiniley, [facility] director.  She was able to reach son by phone and he agrees for pt to be DC'd on oral antibiotics.  Son agrees to have pt be treated at home and be kept comfortbale.  [facility] will continue to follow and follow through with this plan. Updated CSW and Dr. Hilton SinclairWeiting. medicine will continue to follow this pt as needed during the rest of this hospitalization.  Electronic Signatures: Reather LaurenceMantzouris, Kenidy Crossland J (NP)  (Signed 27-Jan-16 12:08)  Authored: Palliative Care Phifer, Harriett SineNancy (MD)  (Signed 27-Jan-16 16:07)  Authored: Palliative Care   Last Updated: 27-Jan-16 16:07 by Phifer, Harriett SineNancy (MD)

## 2014-06-22 NOTE — H&P (Signed)
PATIENT NAME:  Colton Ruiz, Colton Ruiz MR#:  [id number] DATE OF BIRTH:  1935/10/02  DATE OF ADMISSION:  04/20/2014  REFERRING PHYSICIAN: Sheryl Ruiz. Mindi JunkerGottlieb, MD  PRIMARY CARE PHYSICIAN: He follows with the [facility].  CHIEF COMPLAINT: Shortness of breath.   HISTORY OF PRESENT ILLNESS: A 79 year old African American gentleman with a history of COPD with chronic respiratory failure, 2 to 3 Ruiz nasal cannula at baseline, presenting with shortness of breath.  recent discharge from [facility] on 02/26 with discharge diagnoses of COPD exacerbation as well as community-acquired pneumonia. Per orders, said he was discharged on Levaquin; however, per his current medications, it seems that he does not actually have this medication. Regardless, the patient while at home, shortness of breath worsened. Says that he "could not catch his air" with associated cough and chills. Thus, re-presented to the hospital for further workup and evaluation.  REVIEW OF SYSTEMS: He is a somewhat poor historian. However, review of systems:  CONSTITUTIONAL: Positive for fevers, chills, fatigue, weakness.  EYES: Denies blurry vision, double vision, or eye pain.  EARS, NOSE, THROAT: Denies tinnitus, ear pain, hearing loss.  RESPIRATORY: Positive for cough, shortness of breath, wheezing.  CARDIOVASCULAR: Denies chest pain, palpitations, edema.  GASTROINTESTINAL: Denies nausea, vomiting, diarrhea, abdominal pain.  GENITOURINARY: Denies dysuria or hematuria.  ENDOCRINE: Denies nocturia or thyroid problems.  HEMATOLOGIC AND LYMPHATIC: Denies easy bruising, bleeding.  SKIN: Denies rashes or lesions.  MUSCULOSKELETAL: Denies pain in neck, back, shoulder, knees, hips or arthritic symptoms.  NEUROLOGIC: Denies paralysis, paresthesias.  PSYCHIATRIC: Denies anxiety or depressive symptoms.  Otherwise, full review of systems performed by me is negative.   PAST MEDICAL HISTORY: Includes COPD; chronic respiratory failure on 2 to 3 Ruiz nasal  cannula at baseline; coronary disease status post CABG; hyperlipidemia, unspecified; hypertension, essential; type 2 diabetes, uncomplicated.   SOCIAL HISTORY: Remote tobacco use. No alcohol or drug use.   FAMILY HISTORY: No known cardiovascular or pulmonary disorders.   ALLERGIES: PERCOCET.   HOME MEDICATIONS: Include naproxen 250 mg p.o. b.i.d., Tylenol 325 mg 2 tabs p.o. q. 8 hours as needed for pain, Ultram 50 mg p.o. 3 times a day as needed for pain, Maalox 200/200/20 mg as needed 4 times daily, Nitrostat 0.4 mg sublingual as needed for chest pain, digoxin 125 mcg p.o. daily, loperamide 2 mg p.o. as needed for diarrhea, Crestor 20 mg p.o. daily, Plavix 75 mg p.o. daily, Coreg 3.125 mg p.o. b.i.d., Advair 250/50 mcg inhalation 1 puff b.i.d., Ventolin 90 mcg inhalation 2 puffs 4 times a day as needed for shortness of breath, DuoNeb treatment 3 mL 4 times daily as needed for shortness of breath, Lasix 20 mg p.o. daily, Colace 100 mg p.o. daily, bisacodyl 5 mg every other day, Flonase 50 mcg inhalation 2 sprays daily as needed, Protonix 40 mg p.o. b.i.d., Levaquin he was discharged on 750 every other day; however, he actually has medication.   PHYSICAL EXAMINATION:  VITAL SIGNS: Temperature 97.6, heart rate 98, respirations 22, blood pressure 93/53, saturating 90% on supplemental O2. Weight 46 kg, BMI 16.4.  GENERAL: Chronically ill, weak-appearing African American gentleman currently in minimal distress given respiratory status.  HEAD: Normocephalic, atraumatic.  EYES: Pupils equal, round, reactive to light. Extraocular muscles intact. No scleral icterus.  MOUTH: Dry mucosal membrane. Dentition intact. No abscess noted. EARS, NOSE, AND THROAT: Clear without exudates. No external lesions.  NECK: Supple. No thyromegaly. No nodules. No JVD.  PULMONARY: Diminished breath sounds throughout all lung fields with scant expiratory  wheezing that was scattered throughout lung fields as well as basilar  rhonchi. Tachypneic without use of accessory muscles. Poor respiratory effort.  CHEST: Nontender to palpation.  CARDIOVASCULAR: S1, S2, regular rate and rhythm. No murmurs, rubs, or gallops. No edema. Pedal pulses 2+ bilaterally. GASTROINTESTINAL: Soft, nontender, nondistended. No masses. Positive bowel sounds. No hepatosplenomegaly.  MUSCULOSKELETAL: No swelling, clubbing, or edema. Range of motion full in all extremities.  NEUROLOGIC: Cranial nerves II through XII intact. No gross focal neurologic deficits. Sensation intact. Reflexes intact.  SKIN: No ulceration, lesions, rash, cyanosis. Skin warm, dry. Turgor intact.  PSYCHIATRIC: Mood, affect within normal limits. The patient is awake, alert, oriented x 3. Insight and judgment intact.    LABORATORY DATA: Sodium 142, potassium 3.8, chloride 106, bicarbonate of 29, BUN 24, creatinine 1.25, glucose 104. Troponin 0.06. WBC 12.7, hemoglobin 9.8, platelets of 215,000. Chest x-ray performed reveals worsening right basilar pneumonia.   ASSESSMENT AND PLAN: A 79 year old African American gentleman with a history of chronic obstructive pulmonary disease, chronic respiratory failure who uses nasal cannula presenting with shortness of breath.  1.  Acute on chronic respiratory failure, healthcare-associated pneumonia versus chronic obstructive pulmonary disease exacerbation with greater than 5 days in the hospital over the last 3 months. Antibiotic coverage. Will increase, including Levaquin, Zosyn, and vancomycin. Supplemental oxygen to keep oxygen saturation greater than 88%. DuoNeb treatments, steroids. Continue with Advair.  2.  Coronary artery disease status post coronary artery bypass graft. Aspirin, Plavix, beta blockade.  3.  Type 2 diabetes, uncomplicated. Hold p.o. agents. Insulin sliding scale. 4.  Gastroesophageal reflux disease without esophagitis. Continue proton pump inhibitor therapy. 5.  Hyperlipidemia, unspecified. Statin therapy.  6.   Sacral decubitus ulcer present on admission, stage I. We will consult wound therapy.  7.  Venous thromboembolism prophylaxis with heparin subcutaneously.   CODE STATUS: The patient is FULL CODE.   TIME SPENT: 45 minutes.    ____________________________ Cletis Athens. Hower, MD dkh:ST D: 04/20/2014 22:34:33 ET T: 04/20/2014 23:39:44 ET JOB#: [id number]  cc: Cletis Athens. Hower, MD, <Dictator> Jaquil Synetta Shadow MD ELECTRONICALLY SIGNED 04/21/2014 4:19

## 2014-06-22 NOTE — Discharge Summary (Signed)
PATIENT NAME:  Colton GrapesCRISP, Dailen L MR#:  [id number] DATE OF BIRTH:  1935-08-26  DATE OF ADMISSION:  04/20/2014 DATE OF DISCHARGE:  04/22/2014  DISCHARGE DIAGNOSES: 1.  Acute on chronic respiratory failure.  2.  Chronic obstructive pulmonary disease exacerbation.  3.  Right lower lobe pneumonia.  4.  Hypertension.  5.  Atrial fibrillation.  6.  Malnutrition.   CODE STATUS: DNR/DNI.   DISCHARGE MEDICATIONS: 1.  Protonix 40 mg 2 times a day.  2.  Docusate sodium 100 mg daily.  3.  Crestor 20 mg daily.  4.  Advair Diskus 250/50 two puffs 2 times a day.  5.  Singulair 10 mg once a day.  6.  Coreg 3.125 mg 2 times a day. 7.  Plavix 75 mg daily.  8.  Lasix 20 mg daily. 9.  Naproxen 250 mg oral 2 times a day as needed for pain.  10.  Bisacodyl 5 mg oral once a day.  11.  Ventolin HFA 2 puffs inhaled 4 times a day as needed.  12.  Metamucil 1 packet daily as needed.  13.  Flonase 2 sprays nasal once a day.  14.  Ultram 50 mg oral 3 times a day as needed for pain.  15.  Maalox 5 to 10 mL oral 4 times a day as needed.  16.  Tylenol 325 mg 2 tablets every 8 hours as needed.  17.  Nitrostat 0.4 sublingual as needed for chest pain.  18.  DuoNeb 3 mL inhaled 4 times a day as needed for shortness of breath.  19.  Mucinex D 600 mg oral 2 times a day.  20.  Loperamide 2 mg orally as needed for loose stool.  21.  Digoxin 125 mcg daily.  22.  Spiriva 18 mcg inhaled daily.  23.  Levaquin 750 mg oral every other day for 6 days.  24.  Prednisone 60 mg tapered over 6 days.   DISCHARGE INSTRUCTIONS: Continuous oxygen at 2 liters. Low-sodium diet. Activity as tolerated. [facility].   IMAGING STUDIES: Chest x-ray showed right lower lobe pneumonia.   ADMITTING HISTORY AND PHYSICAL AND HOSPITAL COURSE: Please see detailed H and P dictated by Dr. Clint GuyHower. In brief, a 79 year old African American male patient with history of end-stage COPD who has  been worsening over the past few months, was recently discharged for right lower lobe pneumonia, returned to the hospital due to shortness of breath. The patient was admitted for COPD exacerbation, responded well to IV steroids and nebulizers. He was continued on the Levaquin that he was getting at home. He was afebrile, normal white count. The patient returned back to baseline. A family meeting was held by [facility] primary care [facility] in the hospital where he was changed to DNR/DNI, and the patient is being discharged back home in a stable condition with antibiotics, steroid taper and nebulizers.   At the time of discharge, the patient has no wheezing. Lungs sound clear. S1, S2 heard. No edema. He is awake, back to normal.   TIME SPENT ON DAY OF DISCHARGE IN DISCHARGE ACTIVITY: 42 minutes.  ____________________________ Molinda BailiffSrikar R. Myrl Lazarus, MD srs:sb D: 04/23/2014 16:32:39 ET T: 04/24/2014 14:35:12 ET JOB#: [id number]  cc: Wardell HeathSrikar R. Palmina Clodfelter, MD, <Dictator> Orie FishermanSRIKAR R Benjiman Sedgwick MD ELECTRONICALLY SIGNED 05/06/2014 8:24

## 2014-06-22 NOTE — H&P (Signed)
PATIENT NAME:  Colton Ruiz, Colton Ruiz MR#:  [id number] DATE OF BIRTH:  Jul 26, 1935  DATE OF ADMISSION:  04/15/2014  PRIMARY CARE PHYSICIAN: Nonlocal.    REFERRING PHYSICIAN: Rebecka Apley, MD    CHIEF COMPLAINT: Shortness of breath for 2 days.   HISTORY OF PRESENT ILLNESS: Colton 79 years old [place] American Ruiz with Colton history of COPD and pneumonia and CAD was sent from home to ED due to shortness of breath for 2 days. The patient is weak, alert x 2 to 3, and confused. He complains of shortness of breath for the past 2 days with cough and sputum. The patient cannot provide detailed information. Upon review of systems, the patient complains of chest pain, palpitation, nausea, vomiting, and diarrhea. According to previous documents, the patient was just discharged 1 month ago after treatment of pneumonia. The patient has COPD, on home oxygen 2 to 3 liters. The patient was noticed to have Colton white count15. Chest x-ray showed improved aeration right lung base.   PAST MEDICAL HISTORY: COPD, on home oxygen 2 liters, pneumonia, CAD, hyperlipidemia,  hypertension, diabetes, PVD, CAD with non-STEMI.    SOCIAL HISTORY: No smoking or drinking or illicit drugs.  Former smoker.   SURGICAL HISTORY: Endovascular aortic stent placement, history of renal artery stent, and CABG.   FAMILY HISTORY: Unknown. The patient cannot recall his mother and father's history.   ALLERGIES: PERCOCET.   HOME MEDICATIONS: Medication reconciliation list is not done yet. We will update later.    REVIEW OF SYSTEMS:   CONSTITUTIONAL: The patient is confused.  Review of systems is unreliable.   PHYSICAL EXAMINATION: VITAL SIGNS: Temperature 97.4, blood pressure 117/59, pulse 85, O2 saturation 100% on oxygen by nasal cannula.  GENERAL: The patient is awake, alert, x 2, very thin, in malnutrition status, in no acute distress.  HEENT: Pupils round, equal, react to light and accommodation. Dry oral mucosa. Clear pharynx. NECK: Supple. No  JVD or carotid bruit. No lymphadenopathy. No thyromegaly.  CARDIOVASCULAR: S1, S2, regular rate and rhythm. No murmurs or gallops.  PULMONARY: Bilateral air entry. No wheezing or rales. No use of accessory muscles to breathe.  ABDOMEN: Soft. No distention or tenderness. No organomegaly. Bowel sounds present.  EXTREMITIES: No edema, clubbing, or cyanosis. No calf tenderness. Bilateral pedal pulses present.  SKIN: No rash or jaundice.  NEUROLOGY: Colton, O x 2. The patient knows his name and location but does not know the time.  Follows commands.  No focal deficit. Power 2/5. Sensation intact.   LABORATORY DATA: WBC 15, hemoglobin 10.5, platelets 258, glucose 90, BUN 14, creatinine 1.24. Electrolytes are normal. Troponin 0.08.   Chest x-ray showed improved aeration on the right lung base.   EKG shows sinus rhythm with possible premature atrial, incomplete left bundle branch block, left ventricular hypertrophy.   IMPRESSIONS:  1.  Pneumonia with leukocytosis.  2.  Elevated troponin possibly due to demand ischemia.  3.  Chronic obstructive pulmonary disease.  4.  Chronic respiratory failure, on home oxygen.    5.  Hypertension, controlled.  6.  Diabetes.  7.  Coronary artery disease.   PLAN OF TREATMENT: The patient will be admitted to medical floor.  1.  For pneumonia, we will start Zosyn and Levaquin, follow up blood culture, sputum culture, and CBC.  2.  For COPD and chronic respiratory failure, we will continue oxygen by nasal cannula, give DuoNeb, and continue Singulair, Advair.  3.  For CAD with elevated troponin, which is possibly due to demand  ischemia, we will start aspirin and statin and Plavix. Follow up troponin level.  4.  Hypertension, controlled. Continue hypertension medication.  5.  Diabetes, we will start sliding scale.  6.  I discussed the patient's condition and plan of treatment with the patient, also discussed with the patient about the patient's code status. The patient  said that he wants full code, but since the patient is confused, Colton, O x 2, I am not sure patient understands the code status.  According to previous admission documents, Dr. Harvie JuniorPhifer documents the patient's code status was DNR. I will request palliative care consult. I will put the patient on full code now and follow up palliative care consult.  7.  For malnutrition, I will request dietitian consult.    TIME SPENT: About 58 minutes.   ____________________________ Shaune PollackQing Mozell Haber, MD qc:AT D: 04/15/2014 03:35:44 ET T: 04/15/2014 04:22:13 ET JOB#: [id number]  cc: Shaune PollackQing Sanuel Ladnier, MD, <Dictator> Shaune PollackQING Brittie Whisnant MD ELECTRONICALLY SIGNED 04/17/2014 20:52
